# Patient Record
Sex: Female | Born: 1945 | Race: White | Hispanic: No | Marital: Married | Smoking: Never smoker
Health system: Southern US, Community
[De-identification: ages and names within clinical notes are randomized; demographics above are authoritative.]

## PROBLEM LIST (undated history)

## (undated) DIAGNOSIS — R319 Hematuria, unspecified: Secondary | ICD-10-CM

## (undated) DIAGNOSIS — E119 Type 2 diabetes mellitus without complications: Secondary | ICD-10-CM

## (undated) DIAGNOSIS — M810 Age-related osteoporosis without current pathological fracture: Secondary | ICD-10-CM

## (undated) DIAGNOSIS — E559 Vitamin D deficiency, unspecified: Secondary | ICD-10-CM

## (undated) DIAGNOSIS — I10 Essential (primary) hypertension: Secondary | ICD-10-CM

## (undated) DIAGNOSIS — N182 Chronic kidney disease, stage 2 (mild): Secondary | ICD-10-CM

## (undated) DIAGNOSIS — I491 Atrial premature depolarization: Secondary | ICD-10-CM

## (undated) DIAGNOSIS — M791 Myalgia, unspecified site: Secondary | ICD-10-CM

## (undated) DIAGNOSIS — N390 Urinary tract infection, site not specified: Secondary | ICD-10-CM

## (undated) DIAGNOSIS — K219 Gastro-esophageal reflux disease without esophagitis: Secondary | ICD-10-CM

## (undated) DIAGNOSIS — E039 Hypothyroidism, unspecified: Secondary | ICD-10-CM

## (undated) DIAGNOSIS — E538 Deficiency of other specified B group vitamins: Secondary | ICD-10-CM

## (undated) DIAGNOSIS — Z87898 Personal history of other specified conditions: Secondary | ICD-10-CM

## (undated) DIAGNOSIS — D649 Anemia, unspecified: Secondary | ICD-10-CM

## (undated) DIAGNOSIS — E079 Disorder of thyroid, unspecified: Secondary | ICD-10-CM

## (undated) DIAGNOSIS — N950 Postmenopausal bleeding: Secondary | ICD-10-CM

## (undated) DIAGNOSIS — Z8719 Personal history of other diseases of the digestive system: Secondary | ICD-10-CM

## (undated) HISTORY — DX: Age-related osteoporosis without current pathological fracture: M81.0

## (undated) HISTORY — DX: Vitamin D deficiency, unspecified: E55.9

## (undated) HISTORY — DX: Essential (primary) hypertension: I10

## (undated) HISTORY — DX: Myalgia, unspecified site: M79.10

## (undated) HISTORY — DX: Chronic kidney disease, stage 2 (mild): N18.2

## (undated) HISTORY — DX: Hypothyroidism, unspecified: E03.9

## (undated) HISTORY — DX: Disorder of thyroid, unspecified: E07.9

## (undated) HISTORY — DX: Deficiency of other specified B group vitamins: E53.8

## (undated) HISTORY — DX: Hematuria, unspecified: R31.9

## (undated) HISTORY — PX: ANKLE SURGERY: SHX546

---

## 1998-07-07 HISTORY — PX: ANKLE SURGERY: SHX546

## 2005-04-14 ENCOUNTER — Ambulatory Visit: Payer: Self-pay | Admitting: Urology

## 2005-11-25 ENCOUNTER — Ambulatory Visit: Payer: Self-pay | Admitting: *Deleted

## 2007-01-13 ENCOUNTER — Ambulatory Visit: Payer: Self-pay | Admitting: *Deleted

## 2007-01-29 ENCOUNTER — Ambulatory Visit: Payer: Self-pay | Admitting: *Deleted

## 2008-02-10 ENCOUNTER — Ambulatory Visit: Payer: Self-pay | Admitting: *Deleted

## 2009-12-14 ENCOUNTER — Ambulatory Visit: Payer: Self-pay | Admitting: Family Medicine

## 2010-10-27 ENCOUNTER — Emergency Department: Payer: Self-pay | Admitting: Emergency Medicine

## 2011-02-25 ENCOUNTER — Ambulatory Visit: Payer: Self-pay | Admitting: Family Medicine

## 2012-02-26 ENCOUNTER — Ambulatory Visit: Payer: Self-pay | Admitting: Family Medicine

## 2013-02-28 ENCOUNTER — Ambulatory Visit: Payer: Self-pay | Admitting: Nurse Practitioner

## 2015-03-26 ENCOUNTER — Other Ambulatory Visit: Payer: Self-pay | Admitting: Obstetrics and Gynecology

## 2015-03-26 DIAGNOSIS — Z1211 Encounter for screening for malignant neoplasm of colon: Secondary | ICD-10-CM

## 2015-03-26 DIAGNOSIS — E039 Hypothyroidism, unspecified: Secondary | ICD-10-CM

## 2015-03-26 DIAGNOSIS — N182 Chronic kidney disease, stage 2 (mild): Secondary | ICD-10-CM

## 2015-03-26 DIAGNOSIS — R739 Hyperglycemia, unspecified: Secondary | ICD-10-CM

## 2015-03-26 DIAGNOSIS — Z1231 Encounter for screening mammogram for malignant neoplasm of breast: Secondary | ICD-10-CM

## 2015-03-26 DIAGNOSIS — I10 Essential (primary) hypertension: Secondary | ICD-10-CM

## 2017-03-11 ENCOUNTER — Other Ambulatory Visit: Payer: Self-pay | Admitting: Family Medicine

## 2017-03-11 DIAGNOSIS — Z1231 Encounter for screening mammogram for malignant neoplasm of breast: Secondary | ICD-10-CM

## 2019-07-18 ENCOUNTER — Other Ambulatory Visit: Payer: Self-pay | Admitting: Family Medicine

## 2019-07-18 DIAGNOSIS — Z1231 Encounter for screening mammogram for malignant neoplasm of breast: Secondary | ICD-10-CM

## 2019-07-25 ENCOUNTER — Ambulatory Visit: Payer: Self-pay | Admitting: Cardiology

## 2019-07-25 ENCOUNTER — Ambulatory Visit
Admission: RE | Admit: 2019-07-25 | Discharge: 2019-07-25 | Disposition: A | Discharge status: Home / Self Care | Payer: Medicare Other | Source: Ambulatory Visit | Attending: Family Medicine | Admitting: Family Medicine

## 2019-07-25 DIAGNOSIS — Z1231 Encounter for screening mammogram for malignant neoplasm of breast: Secondary | ICD-10-CM | POA: Diagnosis present

## 2019-07-28 ENCOUNTER — Other Ambulatory Visit: Payer: Self-pay | Admitting: Family Medicine

## 2019-07-28 DIAGNOSIS — N6320 Unspecified lump in the left breast, unspecified quadrant: Secondary | ICD-10-CM

## 2019-07-28 DIAGNOSIS — R928 Other abnormal and inconclusive findings on diagnostic imaging of breast: Secondary | ICD-10-CM

## 2019-08-02 ENCOUNTER — Ambulatory Visit
Admission: RE | Admit: 2019-08-02 | Discharge: 2019-08-02 | Disposition: A | Discharge status: Home / Self Care | Payer: Medicare Other | Source: Ambulatory Visit | Attending: Family Medicine | Admitting: Family Medicine

## 2019-08-02 DIAGNOSIS — N6320 Unspecified lump in the left breast, unspecified quadrant: Secondary | ICD-10-CM | POA: Diagnosis present

## 2019-08-02 DIAGNOSIS — R928 Other abnormal and inconclusive findings on diagnostic imaging of breast: Secondary | ICD-10-CM | POA: Diagnosis not present

## 2019-08-07 DIAGNOSIS — I491 Atrial premature depolarization: Secondary | ICD-10-CM | POA: Insufficient documentation

## 2019-08-07 DIAGNOSIS — R002 Palpitations: Secondary | ICD-10-CM | POA: Insufficient documentation

## 2019-08-07 NOTE — Progress Notes (Signed)
Cardiology Office Note  Date:  08/08/2019   ID:  Megan Sheppard, Hepner 1946/06/23, MRN [id number]  PCP:  Marguerita Merles, MD   Chief Complaint  Patient presents with  . other    Palpitations no complaints today. Meds reviewed verbally with pt.    HPI:  [name] is a 74 year old woman with past medical history of Palpitations Hypertension Obesity Nonsmoker, very good chol "hiatal hernia" seen on EGD,  Referred by Dr. Lennox Grumbles for palpitations, ekg with frequent PACs   Lab work reviewed with her in detail today HAB1c 6.4 down to 6.3 Total chol 127 LDL 52  deconditioned , no regular exercise program Weight is high No exercise[place]  Denies any tachycardia or palpitations No near syncope or syncope  Was seen by primary care, noted to have PACs on EKG EKG has been requested on today's visit for our review  EKG performed in the office today personally reviewed by myself on todays visit Shows NSR rate 84 no ST or T wave changes  Long discussion concerning her family history Mother with CAD, CABG Brother with ALS Sister with brain ca   PMH:   has a past medical history of CKD (chronic kidney disease), stage II, Hematuria, Hypertension, Hypothyroidism, Myalgia, Osteoporosis, Thyroid disease, Vitamin B12 deficiency, and Vitamin D deficiency.  PSH:    Past Surgical History:  Procedure Laterality Date  . ANKLE SURGERY Right     Current Outpatient Medications  Medication Sig Dispense Refill  . amLODipine (NORVASC) 5 MG tablet Take by mouth daily.    . benazepril (LOTENSIN) 20 MG tablet Take by mouth daily.    . cholecalciferol (VITAMIN D3) 25 MCG (1000 UNIT) tablet Take 2,000 Units by mouth daily.    . Cyanocobalamin (B-12 PO) Take by mouth daily.    Marland Kitchen levothyroxine (SYNTHROID) 50 MCG tablet Take by mouth daily.     No current facility-administered medications for this visit.    Allergies:   Iodine, Amoxicillin, Hydrochlorothiazide, Penicillin g,  and Sulfa antibiotics   Social History:  The patient     Family History:   family history includes Breast cancer in her sister.    Review of Systems: Review of Systems  Constitutional: Negative.   HENT: Negative.   Respiratory: Negative.   Cardiovascular: Negative.   Gastrointestinal: Negative.   Musculoskeletal: Negative.   Neurological: Negative.   Psychiatric/Behavioral: Negative.   All other systems reviewed and are negative.    PHYSICAL EXAM: VS:  BP (!) 148/84 (BP Location: Right Arm, Patient Position: Sitting, Cuff Size: Normal)   Pulse 84   Ht 5\' 1"  (1.549 m)   Wt 218 lb 8 oz (99.1 kg)   SpO2 98%   BMI 41.29 kg/m  , BMI Body mass index is 41.29 kg/m. GEN: Well nourished, well developed, in no acute distress obese HEENT: normal Neck: no JVD, carotid bruits, or masses Cardiac: RRR; no murmurs, rubs, or gallops,no edema  Respiratory:  clear to auscultation bilaterally, normal work of breathing GI: soft, nontender, nondistended, + BS MS: no deformity or atrophy Skin: warm and dry, no rash Neuro:  Strength and sensation are intact Psych: euthymic mood, full affect   Recent Labs: No results found for requested labs within last 8760 hours.    Lipid Panel No results found for: CHOL, HDL, LDLCALC, TRIG    Wt Readings from Last 3 Encounters:  08/08/19 218 lb 8 oz (99.1 kg)       ASSESSMENT AND  PLAN:  Problem List Items Addressed This Visit      Cardiology Problems   PAC (premature atrial contraction)   Relevant Medications   amLODipine (NORVASC) 5 MG tablet   benazepril (LOTENSIN) 20 MG tablet   Other Relevant Orders   EKG 12-Lead     Other   Palpitations    Other Visit Diagnoses    Morbid obesity (HCC)    -  Primary   Family history of premature coronary artery disease         PACs Noted on EKG through primary care, EKG has been requested Normal EKG on today's visit She reports she is asymptomatic, denies any palpitations, near-syncope or  syncope -Otherwise normal EKG No further testing at this time as she is asymptomatic Discussed various types of arrhythmia in detail with her If she were to become symptomatic we would recommend a ZIO monitor, could consider low-dose beta-blocker but only if symptomatic  Morbid obesity We have encouraged continued exercise, careful diet management in an effort to lose weight.  Essential hypertension Blood pressure reasonable, we have recommended walking program, weight loss No medication changes made  Elevated glucose/borderline diabetes Hemoglobin A1c numbers discussed with her, recommended weight loss, Dietary guide was given to her [place], recommended walking program  Disposition:   F/U as needed   Total encounter time more than 45 minutes  Greater than 50% was spent in counseling and coordination of care with the patient   Signed, Dossie Arbour, M.D., Ph.D. [facility] [place], [place] [phone]

## 2019-08-08 ENCOUNTER — Encounter: Payer: Self-pay | Admitting: Cardiovascular Disease

## 2019-08-08 ENCOUNTER — Other Ambulatory Visit: Payer: Self-pay

## 2019-08-08 ENCOUNTER — Ambulatory Visit (INDEPENDENT_AMBULATORY_CARE_PROVIDER_SITE_OTHER): Payer: Medicare Other | Admitting: Cardiovascular Disease

## 2019-08-08 DIAGNOSIS — Z8249 Family history of ischemic heart disease and other diseases of the circulatory system: Secondary | ICD-10-CM

## 2019-08-08 DIAGNOSIS — I491 Atrial premature depolarization: Secondary | ICD-10-CM | POA: Diagnosis not present

## 2019-08-08 DIAGNOSIS — R002 Palpitations: Secondary | ICD-10-CM

## 2019-08-08 NOTE — Patient Instructions (Signed)

## 2021-03-02 IMAGING — US US BREAST*L* LIMITED INC AXILLA
1 series · 8 of 8 positions shown · non-contrast
Comparison: Previous exam(s).

CLINICAL DATA: Possible mass in the outer left breast on a recent
screening mammogram.

EXAM:
DIGITAL DIAGNOSTIC LEFT MAMMOGRAM WITH TOMO
ULTRASOUND LEFT BREAST

[Series 1: us breast*left* limited inc axilla · 0.07mm/px · 8 of 8 slices shown]
[im 1/8]
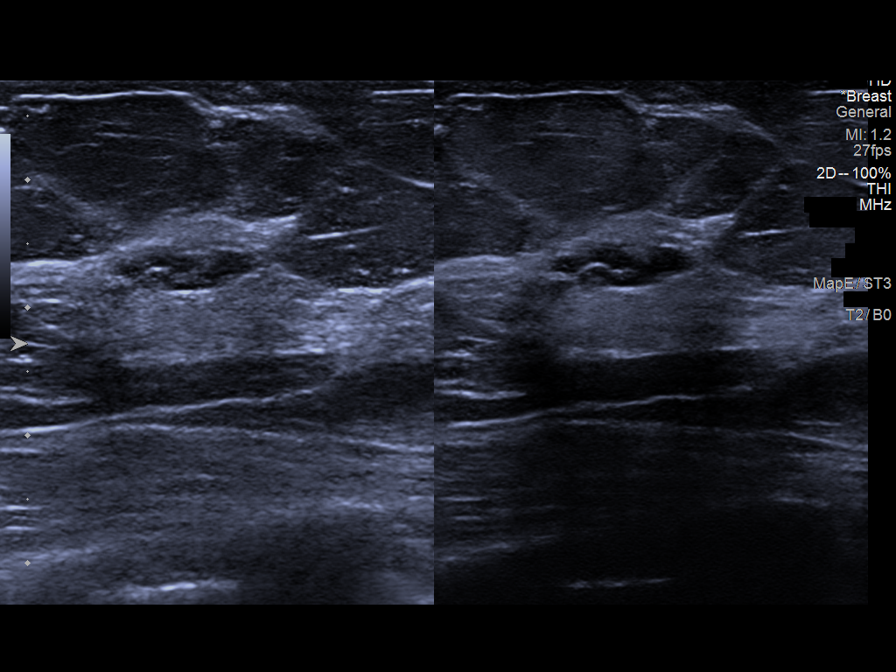
[im 2/8]
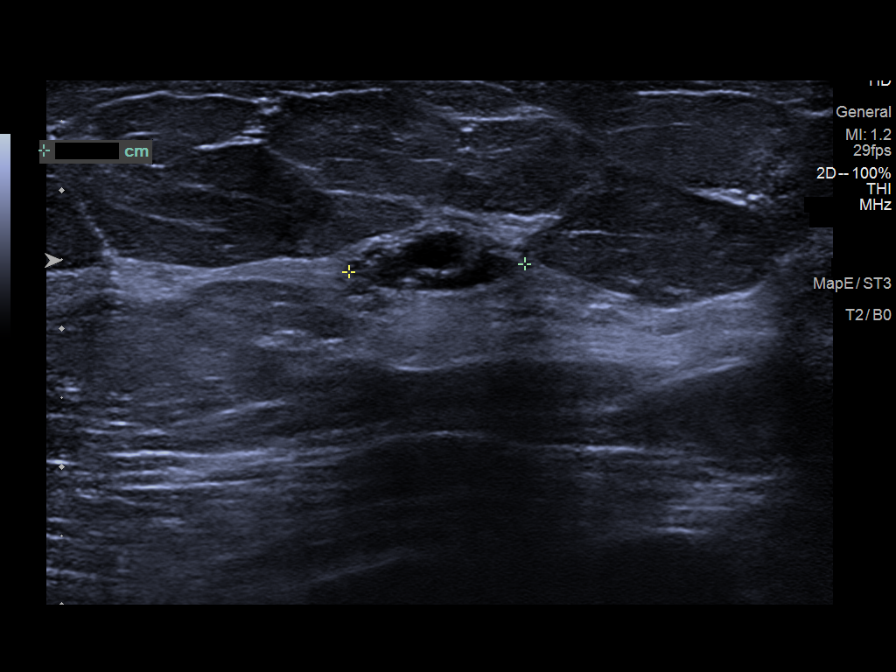
[im 3/8]
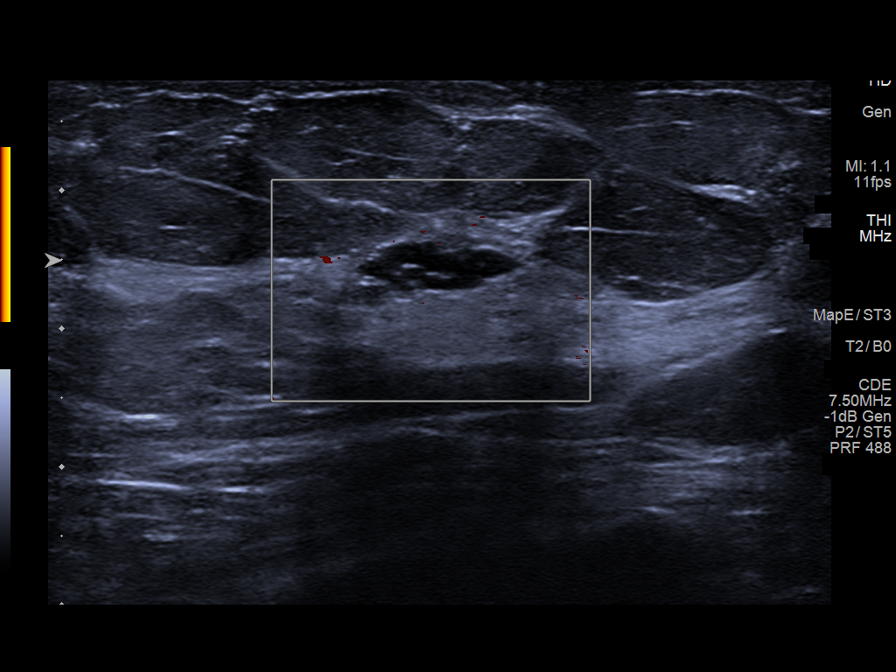
[im 4/8]
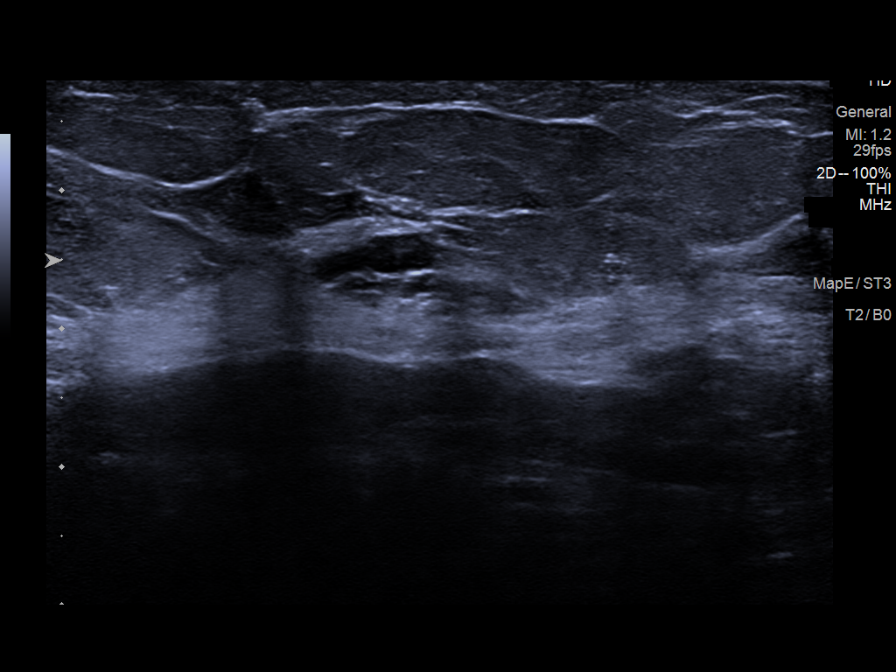
[im 5/8]
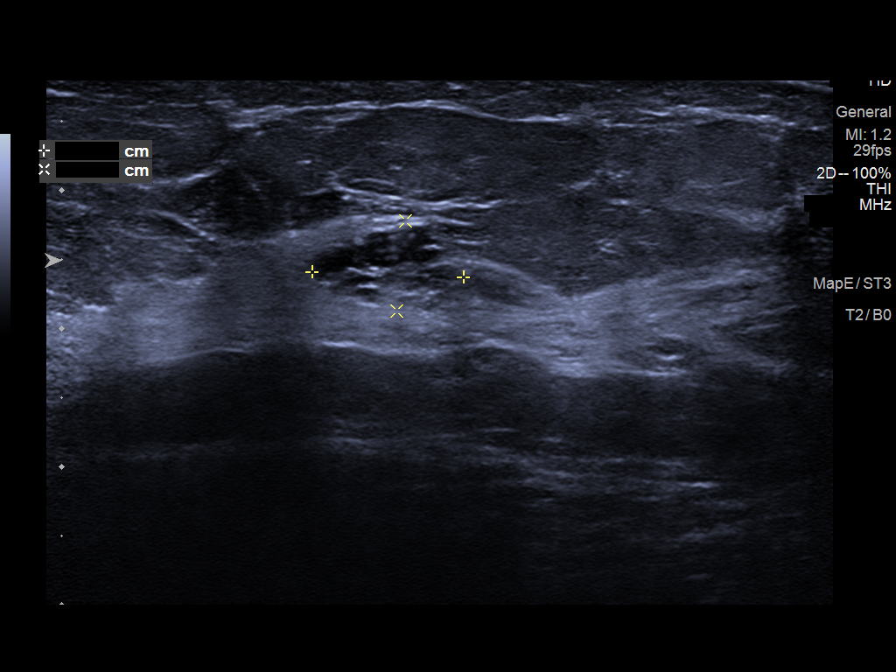
[im 6/8]
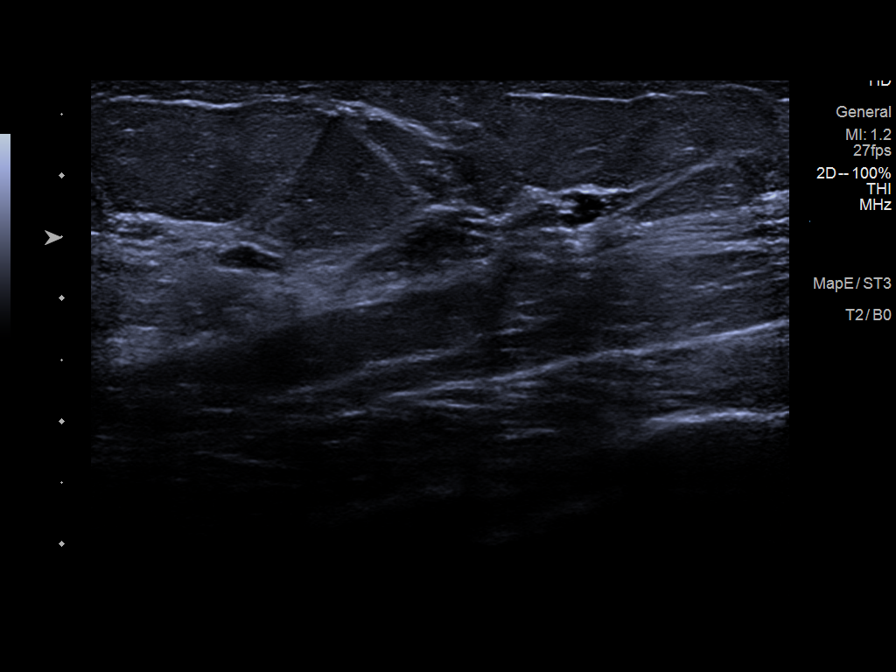
[im 7/8]
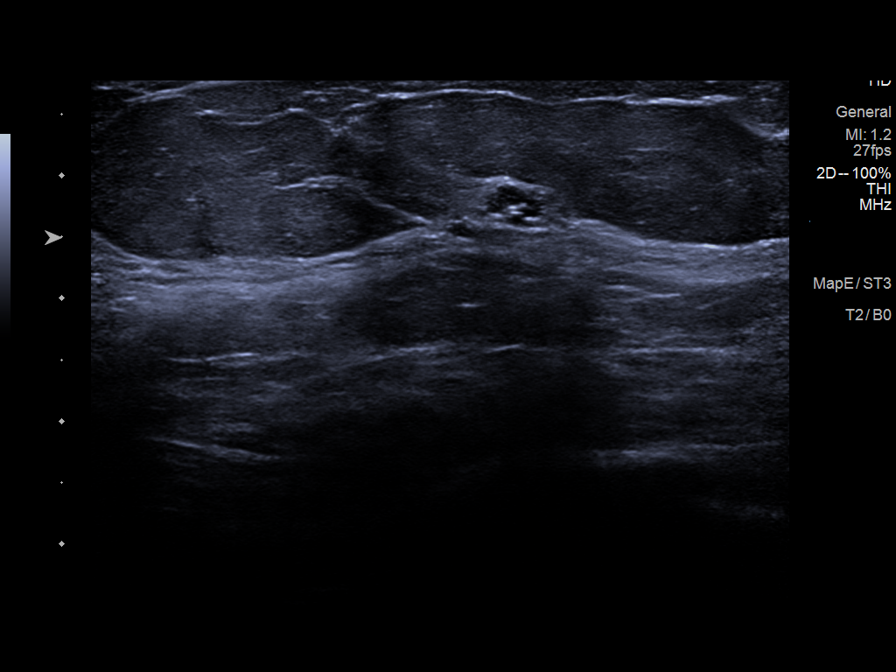
[im 8/8]
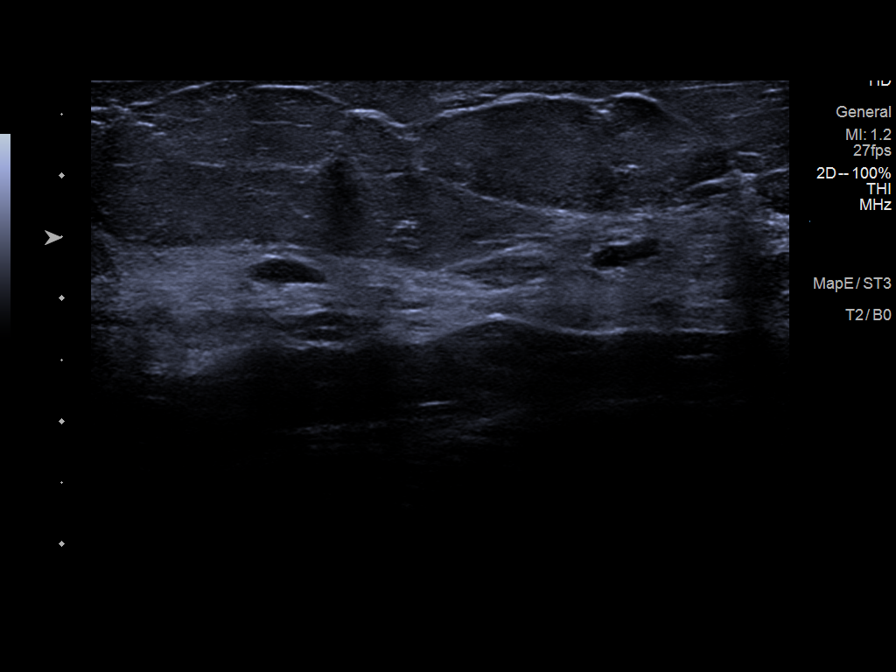

[8 of 8 positions shown; findings below may reference images not displayed]

ACR Breast Density Category b: There are scattered areas of
fibroglandular density.
FINDINGS: 3D tomographic and 2D generated spot compression views of the left
breast were obtained. There is a group multiple small rounded and
oval masses in the outer left breast. The majority of the margins
are circumscribed and others are indistinct or obscured.

Targeted ultrasound is performed, showing a 1.3 cm cluster of cysts
in the 2:30 o'clock position of the left breast, 5 cm from the
nipple. There are multiple additional smaller cysts in that region
of the breast.
IMPRESSION: Benign left breast cysts.  No evidence of malignancy.

RECOMMENDATION:
Bilateral screening mammogram in 1 year.

I have discussed the findings and recommendations with the patient.
If applicable, a reminder letter will be sent to the patient
regarding the next appointment.

BI-RADS CATEGORY  2: Benign.

## 2021-03-02 IMAGING — MG MM DIGITAL DIAGNOSTIC UNILAT*L* W/ TOMO W/ CAD
4 series · 4 of 12 positions shown · non-contrast
Comparison: Previous exam(s).

CLINICAL DATA: Possible mass in the outer left breast on a recent
screening mammogram.

EXAM:
DIGITAL DIAGNOSTIC LEFT MAMMOGRAM WITH TOMO
ULTRASOUND LEFT BREAST

[L MLO synth-2D]
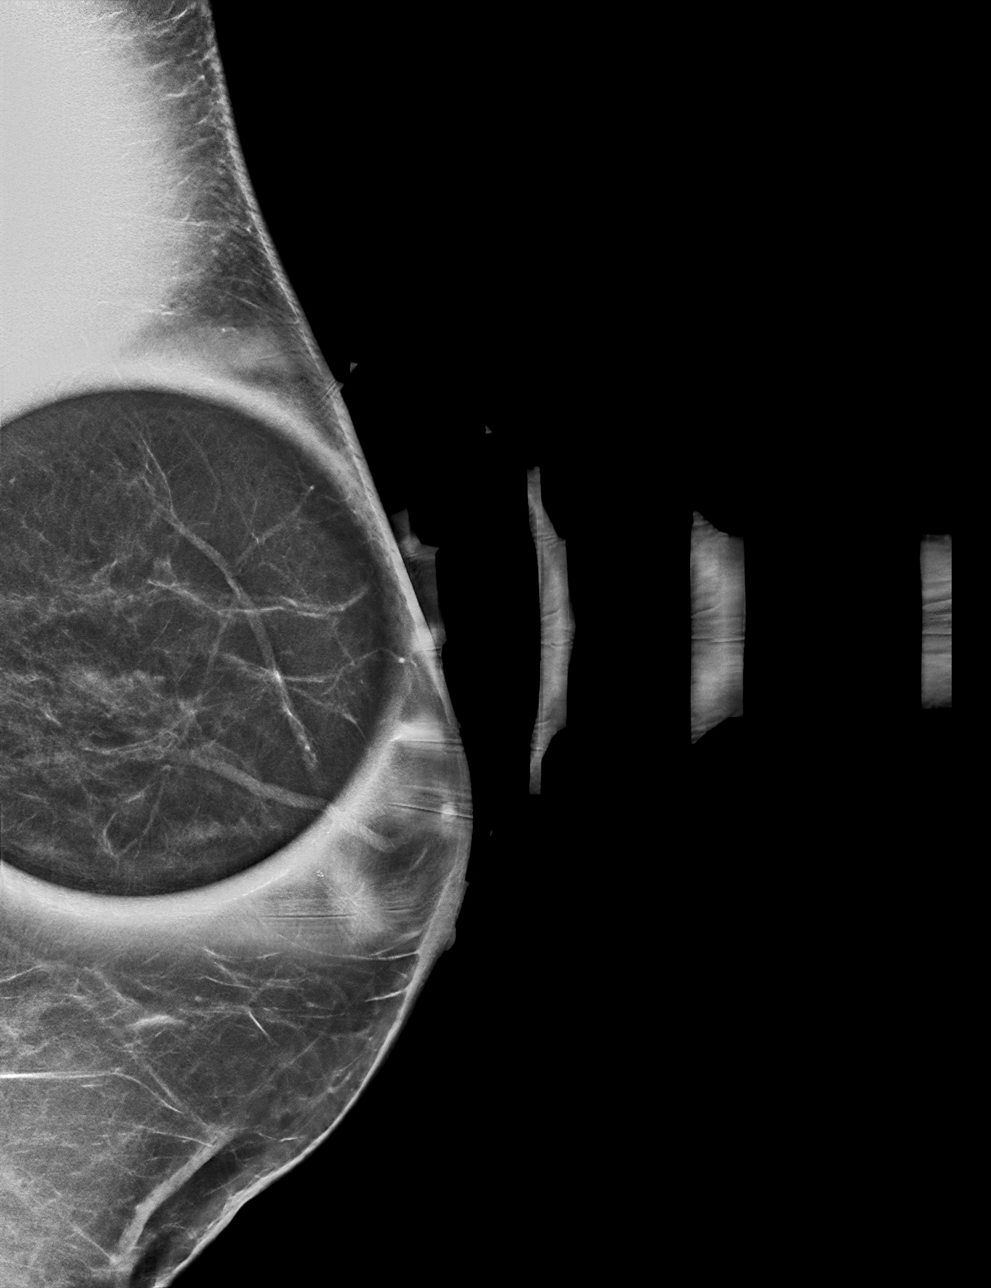

[L CC synth-2D]
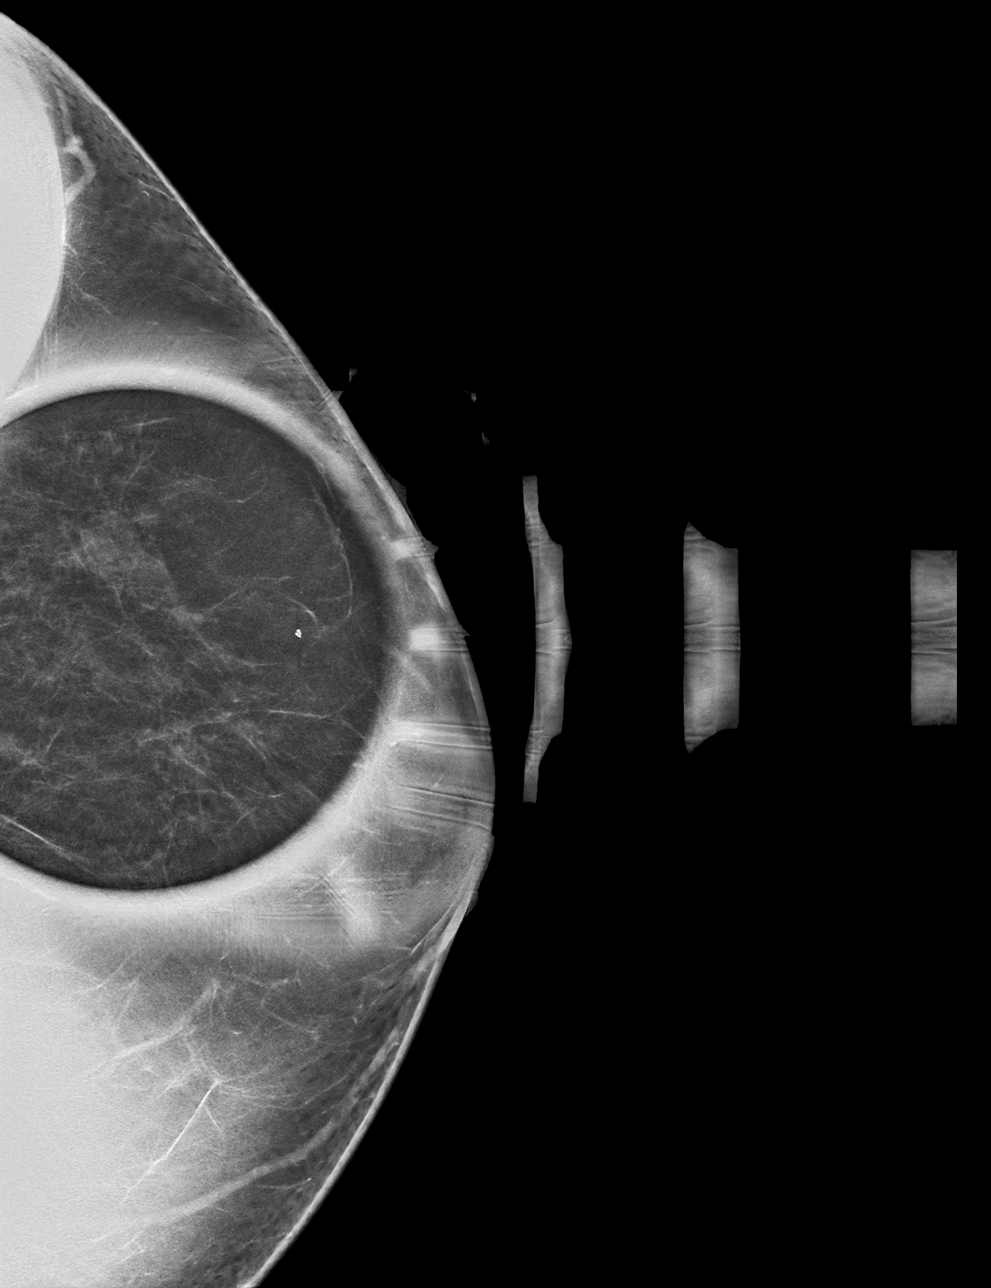

[L MLO tomo · tomo slice 31/60.0]
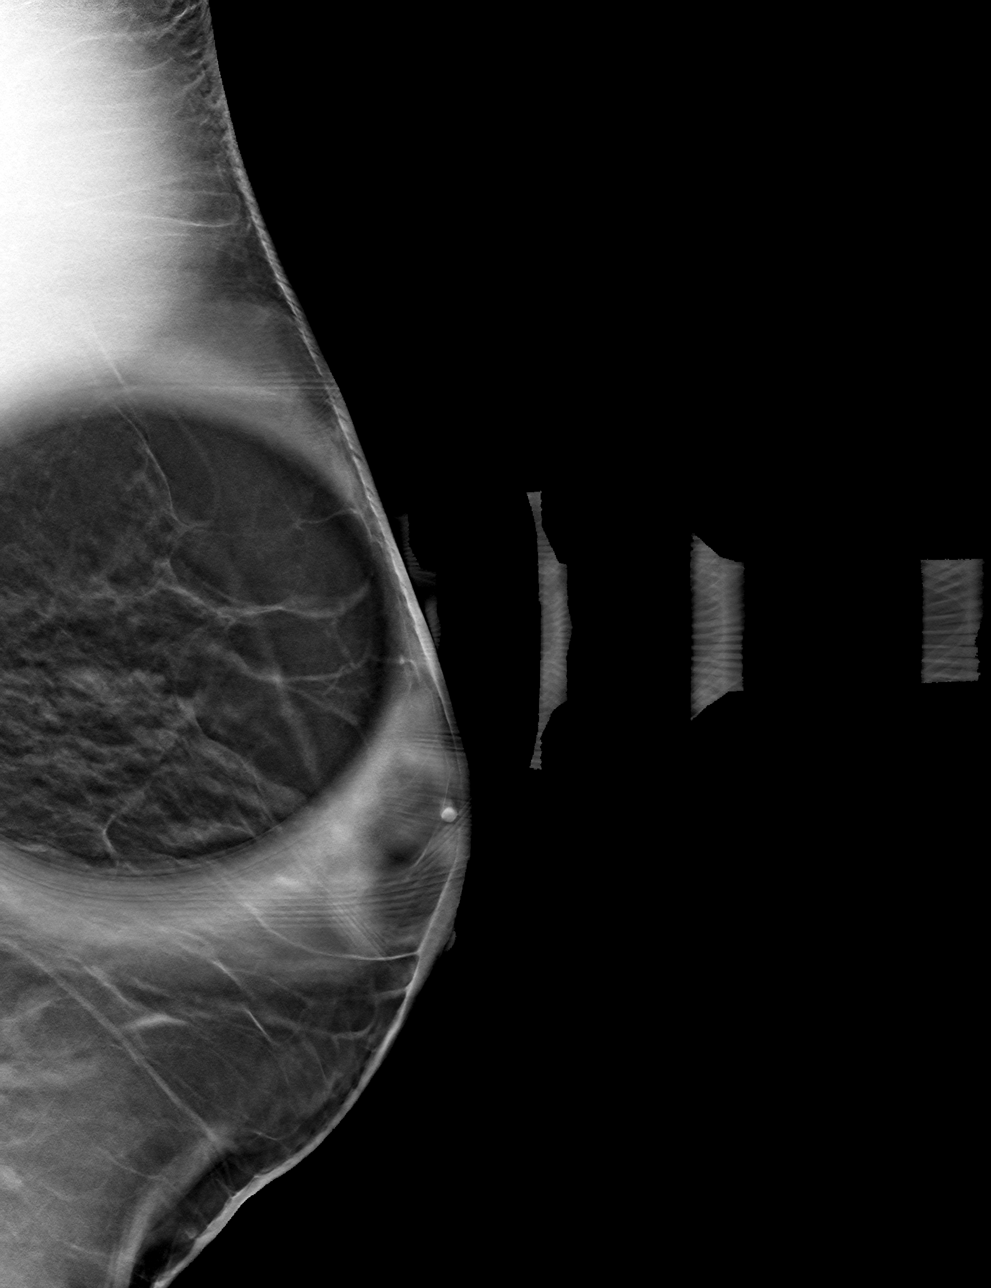

[L CC tomo · tomo slice 27/54.0]
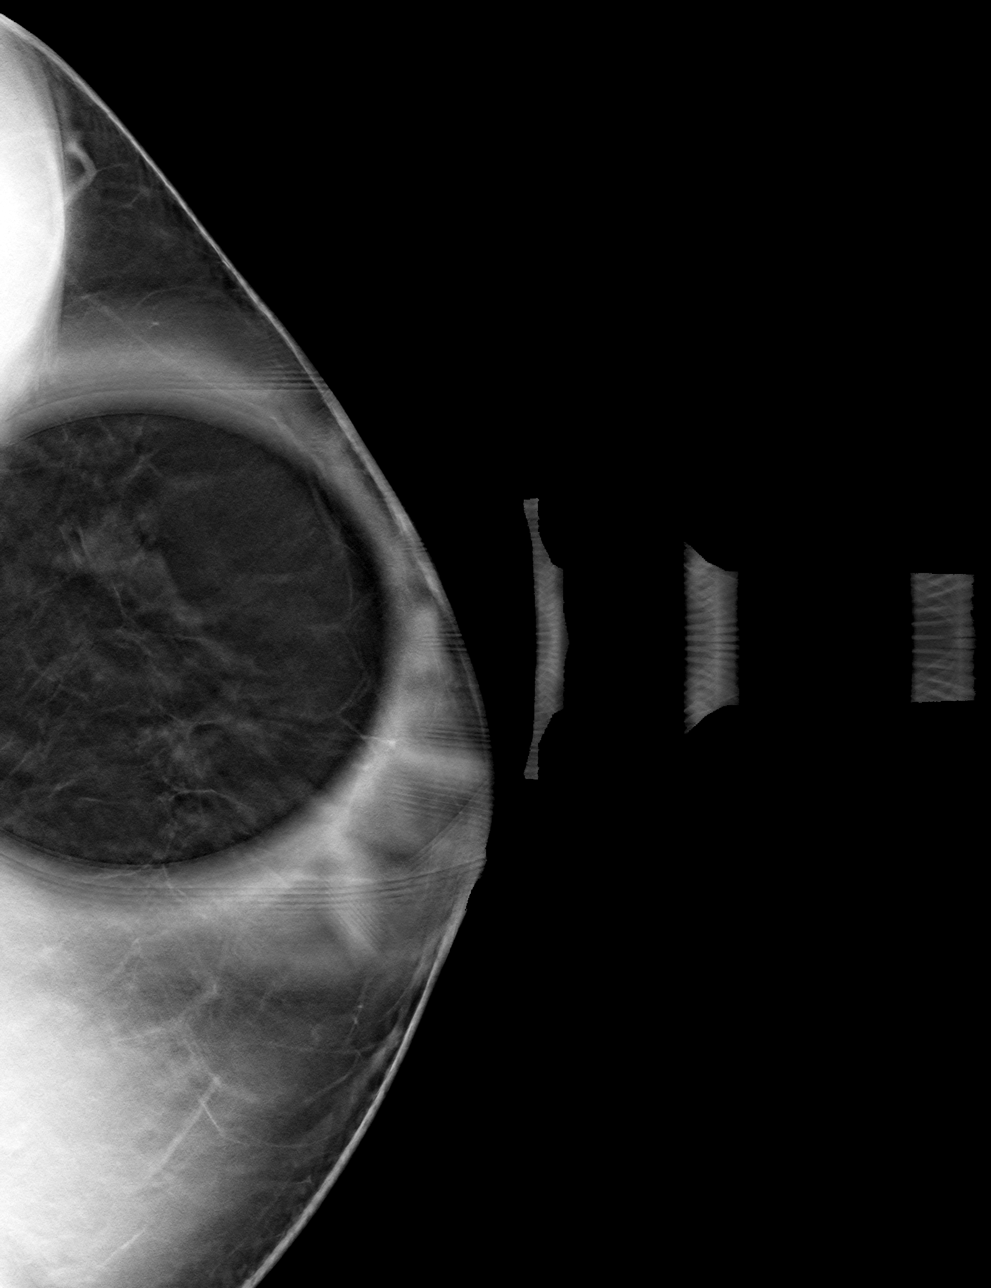

[4 of 12 positions shown; findings below may reference images not displayed]

ACR Breast Density Category b: There are scattered areas of
fibroglandular density.
FINDINGS: 3D tomographic and 2D generated spot compression views of the left
breast were obtained. There is a group multiple small rounded and
oval masses in the outer left breast. The majority of the margins
are circumscribed and others are indistinct or obscured.

Targeted ultrasound is performed, showing a 1.3 cm cluster of cysts
in the 2:30 o'clock position of the left breast, 5 cm from the
nipple. There are multiple additional smaller cysts in that region
of the breast.
IMPRESSION: Benign left breast cysts.  No evidence of malignancy.

RECOMMENDATION:
Bilateral screening mammogram in 1 year.

I have discussed the findings and recommendations with the patient.
If applicable, a reminder letter will be sent to the patient
regarding the next appointment.

BI-RADS CATEGORY  2: Benign.

## 2021-07-03 ENCOUNTER — Other Ambulatory Visit: Payer: Self-pay | Admitting: Family Medicine

## 2021-07-03 DIAGNOSIS — Z1231 Encounter for screening mammogram for malignant neoplasm of breast: Secondary | ICD-10-CM

## 2021-08-06 ENCOUNTER — Other Ambulatory Visit: Payer: Self-pay | Admitting: Ophthalmology

## 2021-08-06 ENCOUNTER — Other Ambulatory Visit
Admission: RE | Admit: 2021-08-06 | Discharge: 2021-08-06 | Disposition: A | Discharge status: Home / Self Care | Payer: Medicare Other | Attending: Ophthalmology | Admitting: Ophthalmology

## 2021-08-06 DIAGNOSIS — H4920 Sixth [abducent] nerve palsy, unspecified eye: Secondary | ICD-10-CM

## 2021-08-06 LAB — CBC WITH DIFFERENTIAL/PLATELET
Abs Immature Granulocytes: 0.04 10*3/uL (ref 0.00–0.07)
Basophils Absolute: 0.1 10*3/uL (ref 0.0–0.1)
Basophils Relative: 1 %
Eosinophils Absolute: 0.1 10*3/uL (ref 0.0–0.5)
Eosinophils Relative: 2 %
HCT: 44.2 % (ref 36.0–46.0)
Hemoglobin: 14.7 g/dL (ref 12.0–15.0)
Immature Granulocytes: 1 %
Lymphocytes Relative: 18 %
Lymphs Abs: 1.5 10*3/uL (ref 0.7–4.0)
MCH: 27.8 pg (ref 26.0–34.0)
MCHC: 33.3 g/dL (ref 30.0–36.0)
MCV: 83.6 fL (ref 80.0–100.0)
Monocytes Absolute: 0.7 10*3/uL (ref 0.1–1.0)
Monocytes Relative: 8 %
Neutro Abs: 5.9 10*3/uL (ref 1.7–7.7)
Neutrophils Relative %: 70 %
Platelets: 230 10*3/uL (ref 150–400)
RBC: 5.29 MIL/uL — ABNORMAL HIGH (ref 3.87–5.11)
RDW: 14.4 % (ref 11.5–15.5)
WBC: 8.3 10*3/uL (ref 4.0–10.5)
nRBC: 0 % (ref 0.0–0.2)

## 2021-08-06 LAB — C-REACTIVE PROTEIN: CRP: 1.9 mg/dL — ABNORMAL HIGH (ref <1.0)

## 2021-08-06 LAB — SEDIMENTATION RATE: Sed Rate: 11 mm/hr (ref 0–30)

## 2021-08-08 ENCOUNTER — Ambulatory Visit
Admission: RE | Admit: 2021-08-08 | Discharge: 2021-08-08 | Disposition: A | Discharge status: Home / Self Care | Payer: Medicare Other | Source: Ambulatory Visit | Attending: Ophthalmology | Admitting: Ophthalmology

## 2021-08-08 DIAGNOSIS — H4920 Sixth [abducent] nerve palsy, unspecified eye: Secondary | ICD-10-CM | POA: Diagnosis present

## 2021-09-03 ENCOUNTER — Encounter (HOSPITAL_COMMUNITY): Payer: Self-pay | Admitting: Radiology

## 2021-12-30 ENCOUNTER — Observation Stay
Admission: EM | Admit: 2021-12-30 | Discharge: 2022-01-01 | Disposition: A | Discharge status: Home / Self Care | Payer: Medicare Other | Attending: Internal Medicine | Admitting: Internal Medicine

## 2021-12-30 ENCOUNTER — Other Ambulatory Visit: Payer: Self-pay

## 2021-12-30 ENCOUNTER — Encounter: Payer: Self-pay | Admitting: Emergency Medicine

## 2021-12-30 ENCOUNTER — Observation Stay: Payer: Medicare Other

## 2021-12-30 DIAGNOSIS — R6520 Severe sepsis without septic shock: Secondary | ICD-10-CM | POA: Insufficient documentation

## 2021-12-30 DIAGNOSIS — K5730 Diverticulosis of large intestine without perforation or abscess without bleeding: Secondary | ICD-10-CM | POA: Insufficient documentation

## 2021-12-30 DIAGNOSIS — I129 Hypertensive chronic kidney disease with stage 1 through stage 4 chronic kidney disease, or unspecified chronic kidney disease: Secondary | ICD-10-CM | POA: Diagnosis not present

## 2021-12-30 DIAGNOSIS — E039 Hypothyroidism, unspecified: Secondary | ICD-10-CM | POA: Diagnosis not present

## 2021-12-30 DIAGNOSIS — N12 Tubulo-interstitial nephritis, not specified as acute or chronic: Secondary | ICD-10-CM | POA: Diagnosis not present

## 2021-12-30 DIAGNOSIS — E1122 Type 2 diabetes mellitus with diabetic chronic kidney disease: Secondary | ICD-10-CM | POA: Diagnosis not present

## 2021-12-30 DIAGNOSIS — Z79899 Other long term (current) drug therapy: Secondary | ICD-10-CM | POA: Diagnosis not present

## 2021-12-30 DIAGNOSIS — E1169 Type 2 diabetes mellitus with other specified complication: Secondary | ICD-10-CM

## 2021-12-30 DIAGNOSIS — A419 Sepsis, unspecified organism: Secondary | ICD-10-CM | POA: Insufficient documentation

## 2021-12-30 DIAGNOSIS — E1165 Type 2 diabetes mellitus with hyperglycemia: Secondary | ICD-10-CM | POA: Insufficient documentation

## 2021-12-30 DIAGNOSIS — N390 Urinary tract infection, site not specified: Secondary | ICD-10-CM | POA: Diagnosis present

## 2021-12-30 DIAGNOSIS — N182 Chronic kidney disease, stage 2 (mild): Secondary | ICD-10-CM | POA: Diagnosis not present

## 2021-12-30 DIAGNOSIS — E119 Type 2 diabetes mellitus without complications: Secondary | ICD-10-CM

## 2021-12-30 LAB — CBC WITH DIFFERENTIAL/PLATELET
Abs Immature Granulocytes: 0.14 10*3/uL — ABNORMAL HIGH (ref 0.00–0.07)
Basophils Absolute: 0 10*3/uL (ref 0.0–0.1)
Basophils Relative: 0 %
Eosinophils Absolute: 0 10*3/uL (ref 0.0–0.5)
Eosinophils Relative: 0 %
HCT: 43 % (ref 36.0–46.0)
Hemoglobin: 13.8 g/dL (ref 12.0–15.0)
Immature Granulocytes: 1 %
Lymphocytes Relative: 2 %
Lymphs Abs: 0.3 10*3/uL — ABNORMAL LOW (ref 0.7–4.0)
MCH: 27.7 pg (ref 26.0–34.0)
MCHC: 32.1 g/dL (ref 30.0–36.0)
MCV: 86.3 fL (ref 80.0–100.0)
Monocytes Absolute: 0.6 10*3/uL (ref 0.1–1.0)
Monocytes Relative: 3 %
Neutro Abs: 18.8 10*3/uL — ABNORMAL HIGH (ref 1.7–7.7)
Neutrophils Relative %: 94 %
Platelets: 261 10*3/uL (ref 150–400)
RBC: 4.98 MIL/uL (ref 3.87–5.11)
RDW: 15.4 % (ref 11.5–15.5)
WBC: 20 10*3/uL — ABNORMAL HIGH (ref 4.0–10.5)
nRBC: 0 % (ref 0.0–0.2)

## 2021-12-30 LAB — BASIC METABOLIC PANEL
Anion gap: 10 (ref 5–15)
BUN: 15 mg/dL (ref 8–23)
CO2: 22 mmol/L (ref 22–32)
Calcium: 8.8 mg/dL — ABNORMAL LOW (ref 8.9–10.3)
Chloride: 107 mmol/L (ref 98–111)
Creatinine, Ser: 1.11 mg/dL — ABNORMAL HIGH (ref 0.44–1.00)
GFR, Estimated: 51 mL/min — ABNORMAL LOW (ref >60)
Glucose, Bld: 242 mg/dL — ABNORMAL HIGH (ref 70–99)
Potassium: 4 mmol/L (ref 3.5–5.1)
Sodium: 139 mmol/L (ref 135–145)

## 2021-12-30 LAB — TROPONIN I (HIGH SENSITIVITY)
Troponin I (High Sensitivity): 7 ng/L (ref <18)
Troponin I (High Sensitivity): 7 ng/L (ref <18)

## 2021-12-30 LAB — LACTIC ACID, PLASMA
Lactic Acid, Venous: 2.6 mmol/L (ref 0.5–1.9)
Lactic Acid, Venous: 2.2 mmol/L (ref 0.5–1.9)

## 2021-12-30 LAB — GLUCOSE, CAPILLARY: Glucose-Capillary: 136 mg/dL — ABNORMAL HIGH (ref 70–99)

## 2021-12-30 LAB — CBG MONITORING, ED: Glucose-Capillary: 166 mg/dL — ABNORMAL HIGH (ref 70–99)

## 2021-12-30 MED ORDER — ONDANSETRON HCL 4 MG/2ML IJ SOLN
4.0000 mg | Freq: Once | INTRAMUSCULAR | Status: DC
  Filled 2021-12-30: qty 2

## 2021-12-30 MED ORDER — ACETAMINOPHEN 650 MG RE SUPP: 650.0000 mg | Freq: Four times a day (QID) | RECTAL | Status: DC | PRN

## 2021-12-30 MED ORDER — SODIUM CHLORIDE 0.45 % IV SOLN: INTRAVENOUS | Status: DC

## 2021-12-30 MED ORDER — SODIUM CHLORIDE 0.9 % IV SOLN
1.0000 g | INTRAVENOUS | Status: DC
  Administered 2021-12-31 – 2022-01-01 (×2): 1 g via INTRAVENOUS
  Filled 2021-12-30: qty 1
  Filled 2021-12-30: qty 10

## 2021-12-30 MED ORDER — SENNA 8.6 MG PO TABS
1.0000 | ORAL_TABLET | Freq: Two times a day (BID) | ORAL | Status: DC
  Administered 2021-12-31 – 2022-01-01 (×2): 8.6 mg via ORAL
  Filled 2021-12-30 (×3): qty 1

## 2021-12-30 MED ORDER — AMLODIPINE BESYLATE 5 MG PO TABS
5.0000 mg | ORAL_TABLET | Freq: Every day | ORAL | Status: DC
  Administered 2021-12-31 – 2022-01-01 (×2): 5 mg via ORAL
  Filled 2021-12-30 (×2): qty 1

## 2021-12-30 MED ORDER — SODIUM CHLORIDE 0.9 % IV BOLUS
1000.0000 mL | Freq: Once | INTRAVENOUS | Status: AC
  Administered 2021-12-30: 1000 mL via INTRAVENOUS

## 2021-12-30 MED ORDER — ACETAMINOPHEN 325 MG PO TABS: 650.0000 mg | ORAL_TABLET | Freq: Four times a day (QID) | ORAL | Status: DC | PRN

## 2021-12-30 MED ORDER — ENOXAPARIN SODIUM 60 MG/0.6ML IJ SOSY
0.5000 mg/kg | PREFILLED_SYRINGE | INTRAMUSCULAR | Status: DC
  Administered 2021-12-30 – 2021-12-31 (×2): 47.5 mg via SUBCUTANEOUS
  Filled 2021-12-30 (×2): qty 0.6

## 2021-12-30 MED ORDER — TRAZODONE HCL 50 MG PO TABS: 25.0000 mg | ORAL_TABLET | Freq: Every evening | ORAL | Status: DC | PRN

## 2021-12-30 MED ORDER — IOHEXOL 300 MG/ML  SOLN: 80.0000 mL | Freq: Once | INTRAMUSCULAR | Status: DC | PRN

## 2021-12-30 MED ORDER — BENAZEPRIL HCL 20 MG PO TABS
20.0000 mg | ORAL_TABLET | Freq: Every day | ORAL | Status: DC
  Administered 2021-12-31 – 2022-01-01 (×2): 20 mg via ORAL
  Filled 2021-12-30 (×2): qty 1

## 2021-12-30 MED ORDER — INSULIN ASPART 100 UNIT/ML IJ SOLN
0.0000 [IU] | Freq: Three times a day (TID) | INTRAMUSCULAR | Status: DC
  Administered 2021-12-30 – 2021-12-31 (×2): 4 [IU] via SUBCUTANEOUS
  Administered 2021-12-31: 3 [IU] via SUBCUTANEOUS
  Administered 2021-12-31: 4 [IU] via SUBCUTANEOUS
  Filled 2021-12-30 (×4): qty 1

## 2021-12-30 MED ORDER — LEVOTHYROXINE SODIUM 50 MCG PO TABS
50.0000 ug | ORAL_TABLET | Freq: Every day | ORAL | Status: DC
  Administered 2021-12-31 – 2022-01-01 (×2): 50 ug via ORAL
  Filled 2021-12-30 (×2): qty 1

## 2021-12-30 MED ORDER — CEFTRIAXONE SODIUM 1 G IJ SOLR
1.0000 g | Freq: Once | INTRAMUSCULAR | Status: AC
  Administered 2021-12-30: 1 g via INTRAVENOUS
  Filled 2021-12-30: qty 10

## 2021-12-30 NOTE — Assessment & Plan Note (Addendum)
Patient has been told she was a borderline diabetic. Not on home hypoglycemics.   --A1c 7.0, well controlled --BG have been within inpatient goal Plan: --d/c BG checks and SSI

## 2021-12-30 NOTE — ED Triage Notes (Signed)
Pt to ED via [facility] walk in for concerns of "urosepsis". Pt states that she has UTI and her white count was 18.6 according to info sent from [facility]. Pt is currently in NAD. VSS. Pt is A & O.

## 2021-12-30 NOTE — ED Notes (Signed)
Receiving RN Charlton Haws has agreed to accept [facility] once pt has arrived to inpatient unit, all questions and concerns address.

## 2021-12-30 NOTE — ED Notes (Signed)
Pt resting, off pt to BR, pt does not need to urinate at this time, NAD noted, even RR and unlabored, call bell within reach for assistance, side rails up x2 for safety, pt voiced no concerns or questions at this time, care on going, will continue to monitor.

## 2021-12-31 DIAGNOSIS — N390 Urinary tract infection, site not specified: Secondary | ICD-10-CM | POA: Diagnosis not present

## 2021-12-31 DIAGNOSIS — N3001 Acute cystitis with hematuria: Secondary | ICD-10-CM

## 2021-12-31 DIAGNOSIS — E039 Hypothyroidism, unspecified: Secondary | ICD-10-CM

## 2021-12-31 LAB — BASIC METABOLIC PANEL
Anion gap: 8 (ref 5–15)
BUN: 13 mg/dL (ref 8–23)
CO2: 23 mmol/L (ref 22–32)
Calcium: 8.1 mg/dL — ABNORMAL LOW (ref 8.9–10.3)
Chloride: 111 mmol/L (ref 98–111)
Creatinine, Ser: 0.96 mg/dL (ref 0.44–1.00)
GFR, Estimated: >60 mL/min (ref >60)
Glucose, Bld: 139 mg/dL — ABNORMAL HIGH (ref 70–99)
Potassium: 3.6 mmol/L (ref 3.5–5.1)
Sodium: 142 mmol/L (ref 135–145)

## 2021-12-31 LAB — CBC
HCT: 38.4 % (ref 36.0–46.0)
Hemoglobin: 12.4 g/dL (ref 12.0–15.0)
MCH: 28.1 pg (ref 26.0–34.0)
MCHC: 32.3 g/dL (ref 30.0–36.0)
MCV: 86.9 fL (ref 80.0–100.0)
Platelets: 211 10*3/uL (ref 150–400)
RBC: 4.42 MIL/uL (ref 3.87–5.11)
RDW: 15.4 % (ref 11.5–15.5)
WBC: 14.6 10*3/uL — ABNORMAL HIGH (ref 4.0–10.5)
nRBC: 0 % (ref 0.0–0.2)

## 2021-12-31 LAB — GLUCOSE, CAPILLARY
Glucose-Capillary: 151 mg/dL — ABNORMAL HIGH (ref 70–99)
Glucose-Capillary: 139 mg/dL — ABNORMAL HIGH (ref 70–99)
Glucose-Capillary: 192 mg/dL — ABNORMAL HIGH (ref 70–99)

## 2021-12-31 LAB — HEMOGLOBIN A1C
Hgb A1c MFr Bld: 7 % — ABNORMAL HIGH (ref 4.8–5.6)
Mean Plasma Glucose: 154 mg/dL

## 2021-12-31 MED ORDER — ORAL CARE MOUTH RINSE: 15.0000 mL | OROMUCOSAL | Status: DC | PRN

## 2022-01-01 DIAGNOSIS — E038 Other specified hypothyroidism: Secondary | ICD-10-CM

## 2022-01-01 DIAGNOSIS — N390 Urinary tract infection, site not specified: Secondary | ICD-10-CM | POA: Diagnosis not present

## 2022-01-01 DIAGNOSIS — N3001 Acute cystitis with hematuria: Secondary | ICD-10-CM | POA: Diagnosis not present

## 2022-01-01 DIAGNOSIS — E1169 Type 2 diabetes mellitus with other specified complication: Secondary | ICD-10-CM | POA: Diagnosis not present

## 2022-01-01 LAB — URINE CULTURE: Culture: <10000 — AB

## 2022-01-01 LAB — GLUCOSE, CAPILLARY: Glucose-Capillary: 175 mg/dL — ABNORMAL HIGH (ref 70–99)

## 2022-01-01 LAB — CBC
HCT: 36.6 % (ref 36.0–46.0)
Hemoglobin: 11.8 g/dL — ABNORMAL LOW (ref 12.0–15.0)
MCH: 28.1 pg (ref 26.0–34.0)
MCHC: 32.2 g/dL (ref 30.0–36.0)
MCV: 87.1 fL (ref 80.0–100.0)
Platelets: 203 10*3/uL (ref 150–400)
RBC: 4.2 MIL/uL (ref 3.87–5.11)
RDW: 15.9 % — ABNORMAL HIGH (ref 11.5–15.5)
WBC: 8.3 10*3/uL (ref 4.0–10.5)
nRBC: 0 % (ref 0.0–0.2)

## 2022-01-01 LAB — BASIC METABOLIC PANEL
Anion gap: 6 (ref 5–15)
BUN: 13 mg/dL (ref 8–23)
CO2: 25 mmol/L (ref 22–32)
Calcium: 8.8 mg/dL — ABNORMAL LOW (ref 8.9–10.3)
Chloride: 109 mmol/L (ref 98–111)
Creatinine, Ser: 1.01 mg/dL — ABNORMAL HIGH (ref 0.44–1.00)
GFR, Estimated: 57 mL/min — ABNORMAL LOW (ref >60)
Glucose, Bld: 162 mg/dL — ABNORMAL HIGH (ref 70–99)
Potassium: 3.6 mmol/L (ref 3.5–5.1)
Sodium: 140 mmol/L (ref 135–145)

## 2022-01-01 LAB — MAGNESIUM: Magnesium: 2.2 mg/dL (ref 1.7–2.4)

## 2022-01-01 MED ORDER — CEFDINIR 300 MG PO CAPS: 300.0000 mg | ORAL_CAPSULE | Freq: Two times a day (BID) | ORAL | 0 refills | Status: AC

## 2022-01-01 NOTE — Progress Notes (Signed)
Chinita Greenland to be D/C'd Home per MD order.  Discussed prescriptions and follow up appointments with the patient. Prescriptions given to patient, medication list explained in detail. Pt verbalized understanding.  Allergies as of 01/01/2022       Reactions   Iodine Hives   Amoxicillin Swelling   Ciprofloxacin    Other reaction(s): Muscle Pain   Hydrochlorothiazide    Other reaction(s): Dizziness Other reaction(s): Dizziness Other reaction(s): Dizziness Other reaction(s): Dizziness Other reaction(s): Dizziness   Penicillin G    Other reaction(s): Angioedema   Sulfa Antibiotics Other (See Comments)   hematuria   Red Dye Hives, Rash        Medication List     TAKE these medications    amLODipine 5 MG tablet Commonly known as: NORVASC Take 5 mg by mouth daily.   benazepril 20 MG tablet Commonly known as: LOTENSIN Take 20 mg by mouth daily.   cefdinir 300 MG capsule Commonly known as: OMNICEF Take 1 capsule (300 mg total) by mouth 2 (two) times daily for 4 days.   cholecalciferol 25 MCG (1000 UNIT) tablet Commonly known as: VITAMIN D3 Take 2,000 Units by mouth daily.   levothyroxine 50 MCG tablet Commonly known as: SYNTHROID Take 50 mcg by mouth daily.   vitamin B-12 50 MCG tablet Commonly known as: CYANOCOBALAMIN Take 1 tablet by mouth daily.        Vitals:   01/01/22 0530 01/01/22 0739  BP: 130/75 (!) 143/70  Pulse: 73 67  Resp: 15 17  Temp: 98.1 F (36.7 C) 98 F (36.7 C)  SpO2: 96% 95%    Skin clean, dry and intact without evidence of skin break down, no evidence of skin tears noted. IV catheter discontinued intact. Site without signs and symptoms of complications. Dressing and pressure applied. Pt denies pain at this time. No complaints noted.  An After Visit Summary was printed and given to the patient. Patient escorted via WC, and D/C home via private auto.  Madie Reno, RN

## 2022-01-01 NOTE — Discharge Summary (Signed)
Physician Discharge Summary  VIDALIA SERPAS PQZ:[id number] DOB: Jun 10, 1946 DOA: 12/30/2021  PCP: Marguerita Merles, MD  Admit date: 12/30/2021 Discharge date: 01/01/2022  Admitted From: home Disposition:  home  Recommendations for Outpatient Follow-up:  Follow up with PCP in 1-2 weeks  Home Health: no Equipment/Devices:  Discharge Condition: stable  CODE STATUS: full  Diet recommendation: Heart Healthy / Carb Modified   Brief/Interim Summary: HPI was taken from Dr. Linda Hedges: Mrs. Splinter, a 76 y/o with hypothyroidism,borderline DM, chronic medial deviation left eye that has been worked up by neuro-ophthalmology and palpation. Three months ago she was diagnosed with a UTI and tx with cipro - she developed myalgia and weakness and stopped medication. She was prescribed doxycycline but never took it as she was doing bettr. About 1 month ago she had a recurrence of dysuria and hematuria. Two weeks ago she developed worsening dysuria, worsening hgematuria and bilateral flank pain. She was seen on the day of admission for dysuria, flank and abdominal pain at her doctor's office. U/A was positive for LE, blood and scant WBCs. CBC revealed a leukocytosis. She was referred to [facility] for evaluation and treatment.     ED Course: afebrile, 103.86 HR 91. Patient in no acute distress at presentation.  Lab: Glucose 242, Cr 1.11, lactic acid 2.6, WBC 20 with 94/2/3. With tachycardia, elevated lactic acid, minimal elevation Cr, keukocytosis and postive U/A code sepsis intiated. She received 1L bolus IVF and was administered Rocphin IV. TRH called to admit for continued elevation and Tx.   As per Dr. Jimmye Norman 01/01/22: Pt was treated for likely UTI w/ IV rocephin. Initial cx grow insignificant growth and repeat urine cx was taken prior to d/c. Pt  was d/c home on po cefdinir to complete the course. Pt was ambulating and transferring independently so PT/OT was not indicated. For more information, please see previous  progress notes.      Discharge Diagnoses:  Principal Problem:   UTI (urinary tract infection) Active Problems:   Severe sepsis (HCC)   DM2 (diabetes mellitus, type 2) (HCC)   Hypothyroidism UTI: w/ hematuria.CT w/ no evidence of urolithiasis, hydronephrosis, or other acute findings. Urine cx shows insignificant growth & repeat urine cx ordered.D/c home on po cefdinir to complete the course.   Severe sepsis: met criteria with tachycardia, leukocytosis, elevated lactic acid & UTI. Resolved.    DM2: well controlled, HbA1c 7.0. Continue on SSI w/ accuchecks    Hypothyroidism: continue on synthroid    Discharge Instructions  Discharge Instructions     Diet - low sodium heart healthy   Complete by: As directed    Diet Carb Modified   Complete by: As directed    Discharge instructions   Complete by: As directed    F/u w/ PCP in 1-2 weeks   Increase activity slowly   Complete by: As directed       Allergies as of 01/01/2022       Reactions   Iodine Hives   Amoxicillin Swelling   Ciprofloxacin    Other reaction(s): Muscle Pain   Hydrochlorothiazide    Other reaction(s): Dizziness Other reaction(s): Dizziness Other reaction(s): Dizziness Other reaction(s): Dizziness Other reaction(s): Dizziness   Penicillin G    Other reaction(s): Angioedema   Sulfa Antibiotics Other (See Comments)   hematuria   Red Dye Hives, Rash        Medication List     TAKE these medications    amLODipine 5 MG tablet Commonly known as: NORVASC  Take 5 mg by mouth daily.   benazepril 20 MG tablet Commonly known as: LOTENSIN Take 20 mg by mouth daily.   cefdinir 300 MG capsule Commonly known as: OMNICEF Take 1 capsule (300 mg total) by mouth 2 (two) times daily for 4 days.   cholecalciferol 25 MCG (1000 UNIT) tablet Commonly known as: VITAMIN D3 Take 2,000 Units by mouth daily.   levothyroxine 50 MCG tablet Commonly known as: SYNTHROID Take 50 mcg by mouth daily.   vitamin  B-12 50 MCG tablet Commonly known as: CYANOCOBALAMIN Take 1 tablet by mouth daily.        Allergies  Allergen Reactions   Iodine Hives   Amoxicillin Swelling   Ciprofloxacin     Other reaction(s): Muscle Pain   Hydrochlorothiazide     Other reaction(s): Dizziness Other reaction(s): Dizziness Other reaction(s): Dizziness Other reaction(s): Dizziness Other reaction(s): Dizziness    Penicillin G     Other reaction(s): Angioedema   Sulfa Antibiotics Other (See Comments)    hematuria   Red Dye Hives and Rash    Consultations:    Procedures/Studies: CT ABDOMEN PELVIS WO CONTRAST  Result Date: 12/30/2021 CLINICAL DATA:  Urosepsis. EXAM: CT ABDOMEN AND PELVIS WITHOUT CONTRAST TECHNIQUE: Multidetector CT imaging of the abdomen and pelvis was performed following the standard protocol without IV contrast. RADIATION DOSE REDUCTION: This exam was performed according to the departmental dose-optimization program which includes automated exposure control, adjustment of the mA and/or kV according to patient size and/or use of iterative reconstruction technique. COMPARISON:  04/14/2005 FINDINGS: Lower chest: No acute findings. Hepatobiliary: No mass visualized on this unenhanced exam. Small cyst is again noted in the inferior right hepatic lobe. Gallbladder is unremarkable. No evidence of biliary ductal dilatation. Pancreas: No mass or inflammatory process visualized on this unenhanced exam. Spleen:  Within normal limits in size. Adrenals/Urinary tract: No evidence of urolithiasis or hydronephrosis. Unremarkable unopacified urinary bladder. Stomach/Bowel: Small hiatal hernia is again seen. No evidence of obstruction, inflammatory process, or abnormal fluid collections. Diverticulosis is seen mainly involving the sigmoid colon, however there is no evidence of diverticulitis. Vascular/Lymphatic: No pathologically enlarged lymph nodes identified. 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 No evidence of abdominal aortic aneurysm. Reproductive:  No mass or other significant abnormality. Other:  None. Musculoskeletal:  No suspicious bone lesions identified. IMPRESSION: No evidence of urolithiasis, hydronephrosis, or other acute findings. Colonic diverticulosis, without radiographic evidence of diverticulitis. 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 Stable small hiatal hernia. Electronically Signed   By: Marlaine Hind M.D.   On: 12/30/2021 15:06   (Echo, Carotid, EGD, Colonoscopy, ERCP)    Subjective: Pt denies any complaints    Discharge Exam: Vitals:   01/01/22 0530 01/01/22 0739  BP: 130/75 (!) 143/70  Pulse: 73 67  Resp: 15 17  Temp: 98.1 F (36.7 C) 98 F (36.7 C)  SpO2: 96% 95%   Vitals:   12/31/21 1607 12/31/21 2054 01/01/22 0530 01/01/22 0739  BP: 126/60 (!) 137/59 130/75 (!) 143/70  Pulse: 72 64 73 67  Resp: 16 18 15 17   Temp: 98.4 F (36.9 C) 98.3 F (36.8 C) 98.1 F (36.7 C) 98 F (36.7 C)  TempSrc: Oral   Oral  SpO2: 98% 97% 96% 95%  Weight:      Height:        General: Pt is alert, awake, not in acute distress Cardiovascular: S1/S2 +, no rubs, no gallops Respiratory: CTA bilaterally, no wheezing, no  rhonchi Abdominal: Soft, NT, obese, bowel sounds + Extremities: no cyanosis    The results of significant diagnostics from this hospitalization (including imaging, microbiology, ancillary and laboratory) are listed below for reference.     Microbiology: Recent Results (from the past 240 hour(s))  Culture, blood (routine x 2)     Status: None (Preliminary result)   Collection Time: 12/30/21 12:07 PM   Specimen: BLOOD LEFT ARM  Result Value Ref Range Status   Specimen Description BLOOD LEFT ARM  Final   Special Requests BAA Blood Culture adequate volume  Final   Culture   Final    NO GROWTH 2 DAYS Performed at [facility], [address], [place], [place] [address]    Report Status PENDING  Incomplete   Culture, blood (routine x 2)     Status: None (Preliminary result)   Collection Time: 12/30/21 12:07 PM   Specimen: BLOOD RIGHT HAND  Result Value Ref Range Status   Specimen Description BLOOD RIGHT HAND  Final   Special Requests BAA Blood Culture adequate volume  Final   Culture   Final    NO GROWTH 2 DAYS Performed at [facility], [address], [place], [place] [address]    Report Status PENDING  Incomplete  Urine Culture     Status: Abnormal   Collection Time: 12/30/21 10:11 PM   Specimen: Urine, Random  Result Value Ref Range Status   Specimen Description   Final    URINE, RANDOM Performed at [facility], [address] [place], [place] [address]    Special Requests   Final    NONE Performed at [facility], [address] [place], [place] [address]    Culture (A)  Final    <10,000 COLONIES/mL INSIGNIFICANT GROWTH Performed at [facility] [address] [place],  [address]    Report Status 01/01/2022 FINAL  Final     Labs: BNP (last 3 results) No results for input(s): "BNP" in the last 8760 hours. Basic Metabolic Panel: Recent Labs  Lab 12/30/21 1100 12/31/21 0419 01/01/22 0338  NA 139 142 140  K 4.0 3.6 3.6  CL 107 111 109  CO2 22 23 25   GLUCOSE 242* 139* 162*  BUN 15 13 13   CREATININE 1.11* 0.96 1.01*  CALCIUM 8.8* 8.1* 8.8*  MG  --   --  2.2   Liver Function Tests: No results for input(s): "AST", "ALT", "ALKPHOS", "BILITOT", "PROT", "ALBUMIN" in the last 168 hours. No results for input(s): "LIPASE", "AMYLASE" in the last 168 hours. No results for input(s): "AMMONIA" in the last 168 hours. CBC: Recent Labs  Lab 12/30/21 1100 12/31/21 0419 01/01/22 0338  WBC 20.0* 14.6* 8.3  NEUTROABS 18.8*  --   --   HGB 13.8 12.4 11.8*  HCT 43.0 38.4 36.6  MCV 86.3 86.9 87.1  PLT 261 211 203   Cardiac Enzymes: No results for input(s): "CKTOTAL", "CKMB", "CKMBINDEX", "TROPONINI" in the last 168  hours. BNP: Invalid input(s): "POCBNP" CBG: Recent Labs  Lab 12/30/21 2120 12/31/21 0802 12/31/21 1151 12/31/21 1626 01/01/22 0802  GLUCAP 136* 151* 139* 192* 175*   D-Dimer No results for input(s): "DDIMER" in the last 72 hours. Hgb A1c Recent Labs    12/30/21 1100  HGBA1C 7.0*   Lipid Profile No results for input(s): "CHOL", "HDL", "LDLCALC", "TRIG", "CHOLHDL", "LDLDIRECT" in the last 72 hours. Thyroid function studies No results for input(s): "TSH", "T4TOTAL", "T3FREE", "THYROIDAB" in the last 72 hours.  Invalid input(s): "FREET3" Anemia work up No results for input(s): "VITAMINB12", "FOLATE", "FERRITIN", "TIBC", "IRON", "RETICCTPCT" in the last 72 hours. Urinalysis No results found for: "COLORURINE", "APPEARANCEUR", "LABSPEC", "PHURINE", "GLUCOSEU", "HGBUR", "BILIRUBINUR", "KETONESUR", "PROTEINUR", "UROBILINOGEN", "NITRITE", "LEUKOCYTESUR" Sepsis Labs Recent Labs  Lab 12/30/21 1100 12/31/21 0419 01/01/22 0338  WBC 20.0* 14.6* 8.3   Microbiology Recent Results (from the past 240 hour(s))  Culture, blood (routine x 2)     Status: None (Preliminary result)   Collection Time: 12/30/21 12:07 PM   Specimen: BLOOD LEFT ARM  Result Value Ref Range Status   Specimen Description BLOOD LEFT ARM  Final   Special Requests BAA Blood Culture adequate volume  Final   Culture   Final    NO GROWTH 2 DAYS Performed at [facility], [address] [place], [place] [address]    Report Status PENDING  Incomplete  Culture, blood (routine x 2)     Status: None (Preliminary result)   Collection Time: 12/30/21 12:07 PM   Specimen: BLOOD RIGHT HAND  Result Value Ref Range Status   Specimen Description BLOOD RIGHT HAND  Final   Special Requests BAA Blood Culture adequate volume  Final   Culture   Final    NO GROWTH 2 DAYS Performed at [facility], [address] [place], [place] [address]    Report Status PENDING  Incomplete  Urine Culture     Status:  Abnormal   Collection Time: 12/30/21 10:11 PM   Specimen: Urine, Random  Result Value Ref Range Status   Specimen Description   Final    URINE, RANDOM Performed at [facility], [address], [place], [place] [address]    Special Requests   Final    NONE Performed at [facility], [address] [place], [place] [address]    Culture (A)  Final    <10,000 COLONIES/mL INSIGNIFICANT GROWTH Performed at [facility], [place] [address] [place], [place] [address]    Report Status 01/01/2022 FINAL  Final     Time coordinating discharge: Over 30 minutes  SIGNED:   Wyvonnia Dusky, MD  [facility] 01/01/2022, 1:01 PM Pager   If 7PM-7AM, please contact night-coverage [email or web address]

## 2022-01-01 NOTE — Progress Notes (Signed)
Mobility Specialist - Progress Note   01/01/22 0900  Mobility  Activity Ambulated independently in hallway  Level of Assistance Independent  Assistive Device None  Distance Ambulated (ft) 500 ft  Activity Response Tolerated well  $Mobility charge 1 Mobility    Clarisa Schools Mobility Specialist 01/01/22, 9:56 AM

## 2022-01-01 NOTE — Plan of Care (Signed)
  Problem: Elimination: Goal: Will not experience complications related to urinary retention Outcome: Progressing   Problem: Pain Managment: Goal: General experience of comfort will improve Outcome: Progressing   Problem: Coping: Goal: Ability to adjust to condition or change in health will improve Outcome: Progressing   

## 2022-01-02 LAB — URINE CULTURE: Culture: <10000 — AB

## 2022-01-04 LAB — CULTURE, BLOOD (ROUTINE X 2)
Culture: NO GROWTH
Culture: NO GROWTH
Special Requests: ADEQUATE
Special Requests: ADEQUATE

## 2023-01-29 ENCOUNTER — Encounter: Payer: Self-pay | Admitting: Emergency Medicine

## 2023-01-29 ENCOUNTER — Emergency Department
Admission: EM | Admit: 2023-01-29 | Discharge: 2023-01-29 | Disposition: A | Discharge status: Home / Self Care | Payer: Medicare Other | Attending: Emergency Medicine | Admitting: Emergency Medicine

## 2023-01-29 ENCOUNTER — Other Ambulatory Visit: Payer: Self-pay

## 2023-01-29 ENCOUNTER — Emergency Department: Payer: Medicare Other

## 2023-01-29 DIAGNOSIS — R000 Tachycardia, unspecified: Secondary | ICD-10-CM | POA: Diagnosis not present

## 2023-01-29 DIAGNOSIS — R1011 Right upper quadrant pain: Secondary | ICD-10-CM | POA: Diagnosis present

## 2023-01-29 DIAGNOSIS — N3000 Acute cystitis without hematuria: Secondary | ICD-10-CM | POA: Diagnosis not present

## 2023-01-29 DIAGNOSIS — E039 Hypothyroidism, unspecified: Secondary | ICD-10-CM | POA: Diagnosis not present

## 2023-01-29 DIAGNOSIS — I10 Essential (primary) hypertension: Secondary | ICD-10-CM | POA: Insufficient documentation

## 2023-01-29 DIAGNOSIS — R7401 Elevation of levels of liver transaminase levels: Secondary | ICD-10-CM | POA: Insufficient documentation

## 2023-01-29 LAB — URINALYSIS, ROUTINE W REFLEX MICROSCOPIC
Bilirubin Urine: NEGATIVE
Glucose, UA: >=500 mg/dL — AB
Hgb urine dipstick: NEGATIVE
Ketones, ur: NEGATIVE mg/dL
Nitrite: POSITIVE — AB
Protein, ur: NEGATIVE mg/dL
Specific Gravity, Urine: 1.023 (ref 1.005–1.030)
WBC, UA: >50 WBC/hpf (ref 0–5)
pH: 5 (ref 5.0–8.0)

## 2023-01-29 LAB — COMPREHENSIVE METABOLIC PANEL
ALT: 101 U/L — ABNORMAL HIGH (ref 0–44)
AST: 251 U/L — ABNORMAL HIGH (ref 15–41)
Albumin: 3.4 g/dL — ABNORMAL LOW (ref 3.5–5.0)
Alkaline Phosphatase: 135 U/L — ABNORMAL HIGH (ref 38–126)
Anion gap: 13 (ref 5–15)
BUN: 11 mg/dL (ref 8–23)
CO2: 21 mmol/L — ABNORMAL LOW (ref 22–32)
Calcium: 8.3 mg/dL — ABNORMAL LOW (ref 8.9–10.3)
Chloride: 100 mmol/L (ref 98–111)
Creatinine, Ser: 1.15 mg/dL — ABNORMAL HIGH (ref 0.44–1.00)
GFR, Estimated: 49 mL/min — ABNORMAL LOW (ref >60)
Glucose, Bld: 317 mg/dL — ABNORMAL HIGH (ref 70–99)
Potassium: 3.9 mmol/L (ref 3.5–5.1)
Sodium: 134 mmol/L — ABNORMAL LOW (ref 135–145)
Total Bilirubin: 2.1 mg/dL — ABNORMAL HIGH (ref 0.3–1.2)
Total Protein: 6.6 g/dL (ref 6.5–8.1)

## 2023-01-29 LAB — CBC WITH DIFFERENTIAL/PLATELET
Abs Immature Granulocytes: 0.06 10*3/uL (ref 0.00–0.07)
Basophils Absolute: 0 10*3/uL (ref 0.0–0.1)
Basophils Relative: 0 %
Eosinophils Absolute: 0 10*3/uL (ref 0.0–0.5)
Eosinophils Relative: 0 %
HCT: 43 % (ref 36.0–46.0)
Hemoglobin: 14.3 g/dL (ref 12.0–15.0)
Immature Granulocytes: 1 %
Lymphocytes Relative: 2 %
Lymphs Abs: 0.2 10*3/uL — ABNORMAL LOW (ref 0.7–4.0)
MCH: 27.3 pg (ref 26.0–34.0)
MCHC: 33.3 g/dL (ref 30.0–36.0)
MCV: 82.2 fL (ref 80.0–100.0)
Monocytes Absolute: 0.3 10*3/uL (ref 0.1–1.0)
Monocytes Relative: 3 %
Neutro Abs: 9.3 10*3/uL — ABNORMAL HIGH (ref 1.7–7.7)
Neutrophils Relative %: 94 %
Platelets: 251 10*3/uL (ref 150–400)
RBC: 5.23 MIL/uL — ABNORMAL HIGH (ref 3.87–5.11)
RDW: 14.7 % (ref 11.5–15.5)
WBC: 10 10*3/uL (ref 4.0–10.5)
nRBC: 0 % (ref 0.0–0.2)

## 2023-01-29 LAB — LIPASE, BLOOD: Lipase: 36 U/L (ref 11–51)

## 2023-01-29 MED ORDER — CEFDINIR 300 MG PO CAPS: 300.0000 mg | ORAL_CAPSULE | Freq: Two times a day (BID) | ORAL | 0 refills | Status: AC

## 2023-01-29 MED ORDER — SODIUM CHLORIDE 0.9 % IV SOLN
2.0000 g | Freq: Once | INTRAVENOUS | Status: AC
  Administered 2023-01-29: 2 g via INTRAVENOUS
  Filled 2023-01-29: qty 20

## 2023-01-29 MED ORDER — GADOBUTROL 1 MMOL/ML IV SOLN
10.0000 mL | Freq: Once | INTRAVENOUS | Status: AC | PRN
  Administered 2023-01-29: 10 mL via INTRAVENOUS

## 2023-01-29 MED ORDER — LACTATED RINGERS IV BOLUS
1000.0000 mL | Freq: Once | INTRAVENOUS | Status: AC
  Administered 2023-01-29: 1000 mL via INTRAVENOUS

## 2023-01-29 MED ORDER — LORAZEPAM 2 MG/ML IJ SOLN
0.5000 mg | Freq: Once | INTRAMUSCULAR | Status: AC
  Administered 2023-01-29: 0.5 mg via INTRAVENOUS
  Filled 2023-01-29: qty 1

## 2023-01-29 NOTE — ED Notes (Signed)
MRI updated on pt, pending call for MRI, meds ordered for claustrophobia, VSS. Husband at [facility].

## 2023-01-29 NOTE — ED Provider Notes (Signed)
[facility] Provider Note    Event Date/Time   First MD Initiated Contact with Patient 01/29/23 0507     (approximate)   History   Flank Pain   HPI  Megan Sheppard is a 77 y.o. female who presents to the ED for evaluation of Flank Pain   I review of medical DC summary from 1 year ago.  Morbidly obese patient was admitted for pyelonephritis at that time.  History of HTN and hypothyroidism.  Patient presents with her husband for evaluation of chronic back, flank and leg discomfort and acute urinary frequency, chills and nausea.  Reports multiple months of right sided flank and back pain, seen by her PCP, urgent care and told it was muscles.  She denies any falls, trauma or injuries.  Ambulating at her baseline.  She reports for the past week or so she has had increased urinary frequency.  Tonight she has had chills and nausea without emesis.  No abdominal pain, stool changes.  Reports somewhat lesser p.o. intake of solids with nausea but no emesis.   Physical Exam   Triage Vital Signs: ED Triage Vitals [01/29/23 0506]  Encounter Vitals Group     BP      Systolic BP Percentile      Diastolic BP Percentile      Pulse      Resp      Temp      Temp src      SpO2      Weight 205 lb (93 kg)     Height 5' (1.524 m)     Head Circumference      Peak Flow      Pain Score      Pain Loc      Pain Education      Exclude from Growth Chart     Most recent vital signs: Vitals:   01/29/23 0508 01/29/23 0545  BP: (!) 145/91   Pulse: (!) 118 (!) 106  Resp: 20   Temp: 99.7 F (37.6 C)   SpO2: 100%     General: Awake, no distress.  Obese CV:  Good peripheral perfusion.  Tachycardic and regular Resp:  Normal effort.  Abd:  No distention.  Soft and benign throughout, minimal suprapubic and RUQ tenderness is present MSK:  No deformity noted.  Neuro:  No focal deficits appreciated. Other:     ED Results / Procedures / Treatments   Labs (all labs  ordered are listed, but only abnormal results are displayed) Labs Reviewed  CBC WITH DIFFERENTIAL/PLATELET - Abnormal; Notable for the following components:      Result Value   RBC 5.23 (*)    Neutro Abs 9.3 (*)    Lymphs Abs 0.2 (*)    All other components within normal limits  COMPREHENSIVE METABOLIC PANEL - Abnormal; Notable for the following components:   Sodium 134 (*)    CO2 21 (*)    Glucose, Bld 317 (*)    Creatinine, Ser 1.15 (*)    Calcium 8.3 (*)    Albumin 3.4 (*)    AST 251 (*)    ALT 101 (*)    Alkaline Phosphatase 135 (*)    Total Bilirubin 2.1 (*)    GFR, Estimated 49 (*)    All other components within normal limits  URINALYSIS, ROUTINE W REFLEX MICROSCOPIC - Abnormal; Notable for the following components:   Color, Urine YELLOW (*)    APPearance HAZY (*)  Glucose, UA >=500 (*)    Nitrite POSITIVE (*)    Leukocytes,Ua SMALL (*)    Bacteria, UA RARE (*)    Non Squamous Epithelial PRESENT (*)    All other components within normal limits  URINE CULTURE  LIPASE, BLOOD    EKG   RADIOLOGY pending  Official radiology report(s): No results found.  PROCEDURES and INTERVENTIONS:  Procedures  Medications  cefTRIAXone (ROCEPHIN) 2 g in sodium chloride 0.9 % 100 mL IVPB (2 g Intravenous New Bag/Given 01/29/23 0627)  lactated ringers bolus 1,000 mL (1,000 mLs Intravenous New Bag/Given 01/29/23 0531)     IMPRESSION / MDM / ASSESSMENT AND PLAN / ED COURSE  I reviewed the triage vital signs and the nursing notes.  Differential diagnosis includes, but is not limited to, MSK pain, UTI, pyelonephritis, biliary colic  {Patient presents with symptoms of an acute illness or injury that is potentially life-threatening.  77 year old woman presents to the ED with acute urinary symptoms superimposed on chronic intermittent right flank symptoms.  Her tachycardia is noted but she has no other SIRS criteria to indicate sepsis protocols at this point.  Neutrophilia is  noted but no leukocytosis.  Normal lipase.  Urine with infectious features, will be sent for culture and she is started on ceftriaxone.  Transaminitis is noted and her chronic right-sided symptoms are possibly biliary colic in nature.  RUQ ultrasound is pending.  Choledocholithiasis is a possibility and she may require MRCP, pending her RUQ ultrasound. Signed out to oncoming provider  Clinical Course as of 01/29/23 0631  Thu Jan 29, 2023  0545 LFTs noted.  Will add lipase and obtain RUQ ultrasound [DS]  5188 Updated patient and reassessed [DS]  0604 Updated patient of UTI.  Ultrasound in the room for RUQ study [DS]    Clinical Course User Index [DS] Delton Prairie, MD     FINAL CLINICAL IMPRESSION(S) / ED DIAGNOSES   Final diagnoses:  Acute cystitis without hematuria  Transaminitis     Rx / DC Orders   ED Discharge Orders     None        Note:  This document was prepared using Dragon voice recognition software and may include unintentional dictation errors.   Delton Prairie, MD 01/29/23 [phone]

## 2023-01-29 NOTE — Discharge Instructions (Addendum)
Follow up with your pcp for recheck of liver enzymes

## 2023-01-29 NOTE — ED Triage Notes (Signed)
Pt presents ambulatory to triage via POV with complaints of R sided flank pain x 2 months. Endorses frequent urination - no hematuria nor fevers. A&Ox4 at this time. Denies CP or SOB.

## 2023-01-29 NOTE — ED Notes (Signed)
Back from MRI, alert, NAD, calm, no changes, IVF complete, husband at [facility], denies questions.

## 2023-01-29 NOTE — ED Notes (Signed)
IVF paused, pt to MRI, alert, NAD, calm, interactive.

## 2023-01-29 NOTE — ED Provider Notes (Signed)
MRCP is overall assuring, patient reports she has tolerated cefdinir in the past, prescription provided, appropriate for outpatient follow-up, strict return precautions discussed   Jene Every, MD 01/29/23 1151

## 2023-03-18 ENCOUNTER — Ambulatory Visit: Payer: Medicare Other | Admitting: Urology

## 2023-03-18 VITALS — BP 157/72 | HR 99 | Ht 60.0 in | Wt 205.0 lb

## 2023-03-18 DIAGNOSIS — Z87440 Personal history of urinary (tract) infections: Secondary | ICD-10-CM | POA: Diagnosis not present

## 2023-03-18 DIAGNOSIS — Z09 Encounter for follow-up examination after completed treatment for conditions other than malignant neoplasm: Secondary | ICD-10-CM | POA: Diagnosis not present

## 2023-03-18 DIAGNOSIS — N390 Urinary tract infection, site not specified: Secondary | ICD-10-CM

## 2023-03-18 LAB — MICROSCOPIC EXAMINATION: Epithelial Cells (non renal): >10 /HPF — AB (ref 0–10)

## 2023-03-18 LAB — URINALYSIS, COMPLETE
Bilirubin, UA: NEGATIVE
Bilirubin, UA: NEGATIVE
Ketones, UA: NEGATIVE
Ketones, UA: NEGATIVE
Leukocytes,UA: NEGATIVE
Nitrite, UA: NEGATIVE
Nitrite, UA: NEGATIVE
Protein,UA: NEGATIVE
RBC, UA: NEGATIVE
Specific Gravity, UA: 1.02 (ref 1.005–1.030)
Specific Gravity, UA: 1.02 (ref 1.005–1.030)
Urobilinogen, Ur: 0.2 mg/dL (ref 0.2–1.0)
Urobilinogen, Ur: 0.2 mg/dL (ref 0.2–1.0)
pH, UA: 5 (ref 5.0–7.5)
pH, UA: 5 (ref 5.0–7.5)

## 2023-03-18 NOTE — Progress Notes (Signed)
I, Duke Salvia, acting as a Neurosurgeon for Riki Altes, MD.,have documented all relevant documentation on the behalf of Riki Altes, MD, as directed by  Riki Altes, MD while in the presence of Riki Altes, MD.  03/18/2023 11:29 AM   Megan Sheppard Jun 28, 1946 [id number]  Referring provider: No referring provider defined for this encounter.  Chief Complaint  Patient presents with   Recurrent UTI    HPI: Megan Sheppard is a 77 y.o. female referred for evaluation of recurrent UTI.  Complains of recurrent UTIs over the last 3 months. Typical symptoms include urinary frequency, nocturia, and dysuria. One episode of hematuria approximately 1 week ago. She states antibiotics help her symptoms while taking it, but they recur once she completes treatment. Recent record review over the last year, she has had multiple urine cultures, all growing multiple species. Her urinalysis has shown significant vaginal contamination with abundant squamous epithelial cells. She was hospitalized June 2023 for sepsis suspected from a urinary source and urinalysis at that visit also had insignificant growth and blood cultures were negative. CT abdomen/pelvis June 2023 showed no hydronephrosis or urinary calculi. She also had an MRI of the abdomen 01/29/23, which showed normal appearing kidneys without hydronephrosis or mass.   PMH: Past Medical History:  Diagnosis Date   CKD (chronic kidney disease), stage II    Hematuria    Hypertension    Hypothyroidism    Myalgia    Osteoporosis    Thyroid disease    Vitamin B12 deficiency    Vitamin D deficiency     Surgical History: Past Surgical History:  Procedure Laterality Date   ANKLE SURGERY Right     Home Medications:  Allergies as of 03/18/2023       Reactions   Iodine Hives   Amoxicillin Swelling   Ciprofloxacin    Other reaction(s): Muscle Pain   Hydrochlorothiazide    Other reaction(s): Dizziness Other reaction(s):  Dizziness Other reaction(s): Dizziness Other reaction(s): Dizziness Other reaction(s): Dizziness   Penicillin G    Other reaction(s): Angioedema   Sulfa Antibiotics Other (See Comments)   hematuria   Red Dye #40 (allura Red) Hives, Rash        Medication List        Accurate as of March 18, 2023 11:29 AM. If you have any questions, ask your nurse or doctor.          amLODipine 5 MG tablet Commonly known as: NORVASC Take 5 mg by mouth daily.   benazepril 20 MG tablet Commonly known as: LOTENSIN Take 20 mg by mouth daily.   cholecalciferol 25 MCG (1000 UNIT) tablet Commonly known as: VITAMIN D3 Take 2,000 Units by mouth daily.   levothyroxine 50 MCG tablet Commonly known as: SYNTHROID Take 50 mcg by mouth daily.   vitamin B-12 50 MCG tablet Commonly known as: CYANOCOBALAMIN Take 1 tablet by mouth daily.        Allergies:  Allergies  Allergen Reactions   Iodine Hives   Amoxicillin Swelling   Ciprofloxacin     Other reaction(s): Muscle Pain   Hydrochlorothiazide     Other reaction(s): Dizziness Other reaction(s): Dizziness Other reaction(s): Dizziness Other reaction(s): Dizziness Other reaction(s): Dizziness    Penicillin G     Other reaction(s): Angioedema   Sulfa Antibiotics Other (See Comments)    hematuria   Red Dye #40 (Allura Red) Hives and Rash    Family History: Family History  Problem Relation Age  of Onset   Breast cancer Sister     Social History:  has no history on file for tobacco use, alcohol use, and drug use.   Physical Exam: BP (!) 157/72   Pulse 99   Ht 5' (1.524 m)   Wt 205 lb (93 kg)   BMI 40.04 kg/m   Constitutional:  Alert and oriented, No acute distress. HEENT: [place] AT, moist mucus membranes.  Trachea midline, no masses. Cardiovascular: No clubbing, cyanosis, or edema. Respiratory: Normal respiratory effort, no increased work of breathing. GI: Abdomen is soft, nontender, nondistended, no abdominal  masses Skin: No rashes, bruises or suspicious lesions. Neurologic: Grossly intact, no focal deficits, moving all 4 extremities. Psychiatric: Normal mood and affect.    Laboratory Data: Urinalysis  2+glucose/trace blood/ 1+leukocytes Microscopy 11-30 WBC/>10 epi/many bacteria    Assessment & Plan:    1. Recurrent UTI Does not meet AUA criteria for recurrent UTIs of women, secondary to lack of culture documentation. Recent urinalysis, including today's, have shown significant vaginal epithelial contamination, which may be affecting her urine cultures. Cath urine obtained today for repeat UA and urine culture. With prior episode gross hematuria, scheduled cystoscopy. We discissed potential preventative measures, including cranberry, D-mannose, and low dose vaginal estrogen, as well as low dose antibiotic prophylaxis.      [facility] [address], [address] [place], [place] [address] [phone]

## 2023-03-18 NOTE — Progress Notes (Signed)
In and Out Catheterization  Patient is present today for a I & O catheterization due to Dirty catch . Patient was cleaned and prepped in a sterile fashion with betadine . A 14FR cath was inserted no complications were noted , 90ml of urine return was noted, urine was yellow in color. A clean urine sample was collected for Urine sample. Bladder was drained  And catheter was removed with out difficulty.    Performed by: Ples Specter CMA

## 2023-03-19 ENCOUNTER — Encounter: Payer: Self-pay | Admitting: Urology

## 2023-03-21 LAB — CULTURE, URINE COMPREHENSIVE

## 2023-04-16 ENCOUNTER — Other Ambulatory Visit: Payer: BLUE CROSS/BLUE SHIELD | Admitting: Urology

## 2023-05-08 DIAGNOSIS — N12 Tubulo-interstitial nephritis, not specified as acute or chronic: Secondary | ICD-10-CM

## 2023-05-08 DIAGNOSIS — A419 Sepsis, unspecified organism: Secondary | ICD-10-CM

## 2023-05-08 HISTORY — DX: Sepsis, unspecified organism: A41.9

## 2023-05-08 HISTORY — DX: Tubulo-interstitial nephritis, not specified as acute or chronic: N12

## 2023-05-17 ENCOUNTER — Emergency Department: Payer: Medicare Other

## 2023-05-17 ENCOUNTER — Other Ambulatory Visit: Payer: Self-pay

## 2023-05-17 ENCOUNTER — Inpatient Hospital Stay
Admission: EM | Admit: 2023-05-17 | Discharge: 2023-05-20 | DRG: 872 | Disposition: A | Discharge status: Home / Self Care | Payer: Medicare Other | Attending: Internal Medicine | Admitting: Internal Medicine

## 2023-05-17 DIAGNOSIS — Z79890 Hormone replacement therapy (postmenopausal): Secondary | ICD-10-CM

## 2023-05-17 DIAGNOSIS — N950 Postmenopausal bleeding: Secondary | ICD-10-CM | POA: Diagnosis present

## 2023-05-17 DIAGNOSIS — Z66 Do not resuscitate: Secondary | ICD-10-CM | POA: Diagnosis present

## 2023-05-17 DIAGNOSIS — Z888 Allergy status to other drugs, medicaments and biological substances status: Secondary | ICD-10-CM | POA: Diagnosis not present

## 2023-05-17 DIAGNOSIS — Z6838 Body mass index (BMI) 38.0-38.9, adult: Secondary | ICD-10-CM

## 2023-05-17 DIAGNOSIS — I10 Essential (primary) hypertension: Secondary | ICD-10-CM | POA: Insufficient documentation

## 2023-05-17 DIAGNOSIS — Z881 Allergy status to other antibiotic agents status: Secondary | ICD-10-CM

## 2023-05-17 DIAGNOSIS — Z880 Allergy status to penicillin: Secondary | ICD-10-CM | POA: Diagnosis not present

## 2023-05-17 DIAGNOSIS — M810 Age-related osteoporosis without current pathological fracture: Secondary | ICD-10-CM | POA: Diagnosis present

## 2023-05-17 DIAGNOSIS — A419 Sepsis, unspecified organism: Principal | ICD-10-CM | POA: Diagnosis present

## 2023-05-17 DIAGNOSIS — Z803 Family history of malignant neoplasm of breast: Secondary | ICD-10-CM | POA: Diagnosis not present

## 2023-05-17 DIAGNOSIS — E119 Type 2 diabetes mellitus without complications: Secondary | ICD-10-CM

## 2023-05-17 DIAGNOSIS — E1122 Type 2 diabetes mellitus with diabetic chronic kidney disease: Secondary | ICD-10-CM | POA: Diagnosis present

## 2023-05-17 DIAGNOSIS — Z23 Encounter for immunization: Secondary | ICD-10-CM | POA: Diagnosis present

## 2023-05-17 DIAGNOSIS — Z882 Allergy status to sulfonamides status: Secondary | ICD-10-CM

## 2023-05-17 DIAGNOSIS — E039 Hypothyroidism, unspecified: Secondary | ICD-10-CM | POA: Diagnosis present

## 2023-05-17 DIAGNOSIS — Z8049 Family history of malignant neoplasm of other genital organs: Secondary | ICD-10-CM

## 2023-05-17 DIAGNOSIS — A4181 Sepsis due to Enterococcus: Principal | ICD-10-CM | POA: Diagnosis present

## 2023-05-17 DIAGNOSIS — Z87440 Personal history of urinary (tract) infections: Secondary | ICD-10-CM | POA: Diagnosis not present

## 2023-05-17 DIAGNOSIS — N12 Tubulo-interstitial nephritis, not specified as acute or chronic: Secondary | ICD-10-CM | POA: Diagnosis not present

## 2023-05-17 DIAGNOSIS — Z79899 Other long term (current) drug therapy: Secondary | ICD-10-CM

## 2023-05-17 DIAGNOSIS — E669 Obesity, unspecified: Secondary | ICD-10-CM | POA: Diagnosis present

## 2023-05-17 DIAGNOSIS — N182 Chronic kidney disease, stage 2 (mild): Secondary | ICD-10-CM | POA: Diagnosis present

## 2023-05-17 DIAGNOSIS — Z91041 Radiographic dye allergy status: Secondary | ICD-10-CM

## 2023-05-17 DIAGNOSIS — Z794 Long term (current) use of insulin: Secondary | ICD-10-CM

## 2023-05-17 DIAGNOSIS — I129 Hypertensive chronic kidney disease with stage 1 through stage 4 chronic kidney disease, or unspecified chronic kidney disease: Secondary | ICD-10-CM | POA: Diagnosis present

## 2023-05-17 DIAGNOSIS — R6520 Severe sepsis without septic shock: Secondary | ICD-10-CM | POA: Diagnosis present

## 2023-05-17 DIAGNOSIS — N10 Acute tubulo-interstitial nephritis: Secondary | ICD-10-CM | POA: Diagnosis present

## 2023-05-17 DIAGNOSIS — R310 Gross hematuria: Secondary | ICD-10-CM | POA: Diagnosis not present

## 2023-05-17 LAB — BASIC METABOLIC PANEL
Anion gap: 8 (ref 5–15)
BUN: 12 mg/dL (ref 8–23)
CO2: 23 mmol/L (ref 22–32)
Calcium: 8.3 mg/dL — ABNORMAL LOW (ref 8.9–10.3)
Chloride: 103 mmol/L (ref 98–111)
Creatinine, Ser: 1.12 mg/dL — ABNORMAL HIGH (ref 0.44–1.00)
GFR, Estimated: 50 mL/min — ABNORMAL LOW (ref >60)
Glucose, Bld: 197 mg/dL — ABNORMAL HIGH (ref 70–99)
Potassium: 4.2 mmol/L (ref 3.5–5.1)
Sodium: 134 mmol/L — ABNORMAL LOW (ref 135–145)

## 2023-05-17 LAB — CBC
HCT: 48.5 % — ABNORMAL HIGH (ref 36.0–46.0)
Hemoglobin: 15.8 g/dL — ABNORMAL HIGH (ref 12.0–15.0)
MCH: 28.2 pg (ref 26.0–34.0)
MCHC: 32.6 g/dL (ref 30.0–36.0)
MCV: 86.6 fL (ref 80.0–100.0)
Platelets: 303 10*3/uL (ref 150–400)
RBC: 5.6 MIL/uL — ABNORMAL HIGH (ref 3.87–5.11)
RDW: 15 % (ref 11.5–15.5)
WBC: 15.9 10*3/uL — ABNORMAL HIGH (ref 4.0–10.5)
nRBC: 0 % (ref 0.0–0.2)

## 2023-05-17 LAB — URINALYSIS, ROUTINE W REFLEX MICROSCOPIC
Bilirubin Urine: NEGATIVE
Glucose, UA: NEGATIVE mg/dL
Ketones, ur: NEGATIVE mg/dL
Nitrite: POSITIVE — AB
Protein, ur: 30 mg/dL — AB
RBC / HPF: >50 RBC/hpf (ref 0–5)
Specific Gravity, Urine: 1.008 (ref 1.005–1.030)
WBC, UA: >50 WBC/hpf (ref 0–5)
pH: 5 (ref 5.0–8.0)

## 2023-05-17 LAB — HEMOGLOBIN A1C
Hgb A1c MFr Bld: 8.3 % — ABNORMAL HIGH (ref 4.8–5.6)
Mean Plasma Glucose: 191.51 mg/dL

## 2023-05-17 LAB — HEPATIC FUNCTION PANEL
ALT: 21 U/L (ref 0–44)
AST: 25 U/L (ref 15–41)
Albumin: 3.5 g/dL (ref 3.5–5.0)
Alkaline Phosphatase: 79 U/L (ref 38–126)
Bilirubin, Direct: 0.3 mg/dL — ABNORMAL HIGH (ref 0.0–0.2)
Indirect Bilirubin: 0.5 mg/dL (ref 0.3–0.9)
Total Bilirubin: 0.8 mg/dL (ref <1.2)
Total Protein: 6.3 g/dL — ABNORMAL LOW (ref 6.5–8.1)

## 2023-05-17 LAB — LACTIC ACID, PLASMA
Lactic Acid, Venous: 2.4 mmol/L (ref 0.5–1.9)
Lactic Acid, Venous: 1.8 mmol/L (ref 0.5–1.9)

## 2023-05-17 LAB — PROCALCITONIN: Procalcitonin: 0.51 ng/mL

## 2023-05-17 LAB — GLUCOSE, CAPILLARY
Glucose-Capillary: 200 mg/dL — ABNORMAL HIGH (ref 70–99)
Glucose-Capillary: 152 mg/dL — ABNORMAL HIGH (ref 70–99)

## 2023-05-17 LAB — LIPASE, BLOOD: Lipase: 33 U/L (ref 11–51)

## 2023-05-17 MED ORDER — ACETAMINOPHEN 500 MG PO TABS
1000.0000 mg | ORAL_TABLET | Freq: Four times a day (QID) | ORAL | Status: DC
  Administered 2023-05-17 – 2023-05-20 (×4): 1000 mg via ORAL
  Filled 2023-05-17 (×8): qty 2

## 2023-05-17 MED ORDER — ONDANSETRON HCL 4 MG/2ML IJ SOLN
4.0000 mg | Freq: Four times a day (QID) | INTRAMUSCULAR | Status: DC | PRN
Start: 2023-05-17 — End: 2023-05-20

## 2023-05-17 MED ORDER — SODIUM CHLORIDE 0.9 % IV SOLN
2.0000 g | INTRAVENOUS | Status: DC
  Administered 2023-05-18 – 2023-05-20 (×3): 2 g via INTRAVENOUS
  Filled 2023-05-17 (×3): qty 20

## 2023-05-17 MED ORDER — SODIUM CHLORIDE 0.9 % IV SOLN
1.0000 g | INTRAVENOUS | Status: AC
  Administered 2023-05-17: 1 g via INTRAVENOUS
  Filled 2023-05-17: qty 10

## 2023-05-17 MED ORDER — INSULIN ASPART 100 UNIT/ML IJ SOLN
0.0000 [IU] | Freq: Three times a day (TID) | INTRAMUSCULAR | Status: DC
  Administered 2023-05-17: 2 [IU] via SUBCUTANEOUS
  Administered 2023-05-18: 1 [IU] via SUBCUTANEOUS
  Administered 2023-05-18: 2 [IU] via SUBCUTANEOUS
  Administered 2023-05-18: 1 [IU] via SUBCUTANEOUS
  Administered 2023-05-19 (×2): 2 [IU] via SUBCUTANEOUS
  Administered 2023-05-19: 1 [IU] via SUBCUTANEOUS
  Administered 2023-05-20: 2 [IU] via SUBCUTANEOUS
  Administered 2023-05-20: 1 [IU] via SUBCUTANEOUS
  Filled 2023-05-17 (×9): qty 1

## 2023-05-17 MED ORDER — LACTATED RINGERS IV SOLN
150.0000 mL/h | INTRAVENOUS | Status: AC
  Administered 2023-05-17 – 2023-05-18 (×4): 150 mL/h via INTRAVENOUS

## 2023-05-17 MED ORDER — INSULIN ASPART 100 UNIT/ML IJ SOLN
3.0000 [IU] | Freq: Three times a day (TID) | INTRAMUSCULAR | Status: DC
  Administered 2023-05-17 – 2023-05-20 (×9): 3 [IU] via SUBCUTANEOUS
  Filled 2023-05-17 (×9): qty 1

## 2023-05-17 MED ORDER — PNEUMOCOCCAL 20-VAL CONJ VACC 0.5 ML IM SUSY
0.5000 mL | PREFILLED_SYRINGE | INTRAMUSCULAR | Status: AC
  Administered 2023-05-18: 0.5 mL via INTRAMUSCULAR
  Filled 2023-05-17: qty 0.5

## 2023-05-17 MED ORDER — ONDANSETRON HCL 4 MG PO TABS: 4.0000 mg | ORAL_TABLET | Freq: Four times a day (QID) | ORAL | Status: DC | PRN

## 2023-05-17 MED ORDER — IBUPROFEN 400 MG PO TABS
400.0000 mg | ORAL_TABLET | Freq: Once | ORAL | Status: AC
  Administered 2023-05-17: 400 mg via ORAL
  Filled 2023-05-17: qty 1

## 2023-05-17 MED ORDER — LACTATED RINGERS IV BOLUS
1000.0000 mL | Freq: Once | INTRAVENOUS | Status: AC
  Administered 2023-05-17: 1000 mL via INTRAVENOUS

## 2023-05-17 NOTE — ED Notes (Signed)
Per Fanny Bien, MD order and communication to RN. Rn to hang LR bolus and then recheck lactic

## 2023-05-17 NOTE — ED Triage Notes (Signed)
Pt comes with blood and blood clots in her urine. Pt states back pain . Pt states nausea. Pt states this all started yesterday morning. Pt states hx of kidney infection. Pt states she took AZO pills with little relief.

## 2023-05-17 NOTE — Assessment & Plan Note (Signed)
UA indicative of infection w/ flank pain,hematuria, malaise at home Meeting sepsis criteria  CT grossly stable  IV rocephin  Urine culture  Follow

## 2023-05-17 NOTE — Assessment & Plan Note (Signed)
 Cont synthroid

## 2023-05-17 NOTE — Progress Notes (Signed)
CODE SEPSIS - PHARMACY COMMUNICATION  **Broad Spectrum Antibiotics should be administered within 1 hour of Sepsis diagnosis**  Time Code Sepsis Called/Page Received: 8119  Antibiotics Ordered: Ceftriaxone  Time of 1st antibiotic administration: 1000  Additional action taken by pharmacy: N/A  Tressie Ellis 05/17/2023  10:28 AM

## 2023-05-17 NOTE — ED Notes (Signed)
Pt walked to bathroom and voided, back in bed.

## 2023-05-17 NOTE — ED Provider Notes (Signed)
[facility] Provider Note    Event Date/Time   First MD Initiated Contact with Patient 05/17/23 [phone]     (approximate)   History   Hematuria   HPI  Megan Sheppard is a 77 y.o. female has a history of urinary tract infections, sepsis, hypothyroidism and diabetes   Patient noticed a couple days ago a discomfort in her right mid back and flank area.  She reports her sort of sharp discomfort and burning feeling.  Is persisted and along with that she started noticing blood in her urine and a feeling of a bit of lightheadedness today.  She felt a little bit like she might of had a fever yesterday but could not check it at home  Mild nausea no vomiting.  No chest pain or shortness of breath no headache.  She reports symptoms are sort of reminiscent of when she was in the hospital with possible "sepsis" but a year ago  Physical Exam   Triage Vital Signs: ED Triage Vitals [05/17/23 0835]  Encounter Vitals Group     BP (!) 153/119     Systolic BP Percentile      Diastolic BP Percentile      Pulse Rate (!) 112     Resp 18     Temp 98 F (36.7 C)     Temp src      SpO2 98 %     Weight 198 lb (89.8 kg)     Height 5' (1.524 m)     Head Circumference      Peak Flow      Pain Score 7     Pain Loc      Pain Education      Exclude from Growth Chart     Most recent vital signs: Vitals:   05/17/23 1205 05/17/23 1226  BP: 104/70   Pulse:    Resp:    Temp:  97.9 F (36.6 C)  SpO2:       General: Awake, no distress.  CV:  Good peripheral perfusion.  Slight tachycardia Resp:  Normal effort.  Clear bilateral Abd:  No distention.  Soft nontender nondistended throughout No costovertebral angle tenderness on the left Reports tenderness and reproduction of right flank pain with percussion of the right costovertebral angle Other:     ED Results / Procedures / Treatments   Labs (all labs ordered are listed, but only abnormal results are  displayed) Labs Reviewed  URINALYSIS, ROUTINE W REFLEX MICROSCOPIC - Abnormal; Notable for the following components:      Result Value   Color, Urine AMBER (*)    APPearance CLOUDY (*)    Hgb urine dipstick LARGE (*)    Protein, ur 30 (*)    Nitrite POSITIVE (*)    Leukocytes,Ua MODERATE (*)    Bacteria, UA FEW (*)    All other components within normal limits  CBC - Abnormal; Notable for the following components:   WBC 15.9 (*)    RBC 5.60 (*)    Hemoglobin 15.8 (*)    HCT 48.5 (*)    All other components within normal limits  BASIC METABOLIC PANEL - Abnormal; Notable for the following components:   Sodium 134 (*)    Glucose, Bld 197 (*)    Creatinine, Ser 1.12 (*)    Calcium 8.3 (*)    GFR, Estimated 50 (*)    All other components within normal limits  HEPATIC FUNCTION PANEL - Abnormal; Notable for  the following components:   Total Protein 6.3 (*)    Bilirubin, Direct 0.3 (*)    All other components within normal limits  LACTIC ACID, PLASMA - Abnormal; Notable for the following components:   Lactic Acid, Venous 2.4 (*)    All other components within normal limits  URINE CULTURE  CULTURE, BLOOD (ROUTINE X 2)  CULTURE, BLOOD (ROUTINE X 2)  LIPASE, BLOOD  LACTIC ACID, PLASMA     RADIOLOGY  CT imaging interpreted by me as grossly negative for pyelonephritis or obvious gross abnormality  CT Renal Stone Study  Result Date: 05/17/2023 CLINICAL DATA:  Right flank pain and hematuria. EXAM: CT ABDOMEN AND PELVIS WITHOUT CONTRAST TECHNIQUE: Multidetector CT imaging of the abdomen and pelvis was performed following the standard protocol without IV contrast. RADIATION DOSE REDUCTION: This exam was performed according to the departmental dose-optimization program which includes automated exposure control, adjustment of the mA and/or kV according to patient size and/or use of iterative reconstruction technique. COMPARISON:  CT abdomen and pelvis dated 12/30/2021. FINDINGS: Lower  chest: No acute abnormality. Hepatobiliary: Benign hepatic cysts are redemonstrated. No imaging follow-up is recommended for this finding. No gallstones, gallbladder wall thickening, or biliary dilatation. Pancreas: Unremarkable. No pancreatic ductal dilatation or surrounding inflammatory changes. Spleen: Normal in size without focal abnormality. Adrenals/Urinary Tract: Adrenal glands are unremarkable. Kidneys are normal, without renal calculi, focal lesion, or hydronephrosis. Bladder is unremarkable. Stomach/Bowel: There is a small hiatal hernia. Appendix appears normal. There is colonic diverticulosis without evidence of diverticulitis. No evidence of bowel wall thickening, distention, or inflammatory changes. 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 No enlarged abdominal or pelvic lymph nodes. Reproductive: Uterus and bilateral adnexa are unremarkable. Other: No abdominal wall hernia or abnormality. No abdominopelvic ascites. Musculoskeletal: Degenerative changes are seen in the spine. IMPRESSION: 1. No acute findings in the abdomen or pelvis. No evidence of nephrolithiasis or hydronephrosis. Electronically Signed   By: Romona Curls M.D.   On: 05/17/2023 10:06      PROCEDURES:  Critical Care performed: Yes, see critical care procedure note(s)  CRITICAL CARE Performed by: Sharyn Creamer   Total critical care time: 30 minutes  Critical care time was exclusive of separately billable procedures and treating other patients.  Critical care was necessary to treat or prevent imminent or life-threatening deterioration.  Critical care was time spent personally by me on the following activities: development of treatment plan with patient and/or surrogate as well as nursing, discussions with consultants, evaluation of patient's response to treatment, examination of patient, obtaining history from patient or surrogate, ordering and performing treatments and  interventions, ordering and review of laboratory studies, ordering and review of radiographic studies, pulse oximetry and re-evaluation of patient's condition.   Procedures   MEDICATIONS ORDERED IN ED: Medications  acetaminophen (TYLENOL) tablet 1,000 mg (1,000 mg Oral Given 05/17/23 0957)  cefTRIAXone (ROCEPHIN) 2 g in sodium chloride 0.9 % 100 mL IVPB (has no administration in time range)  ibuprofen (ADVIL) tablet 400 mg (400 mg Oral Given 05/17/23 0944)  cefTRIAXone (ROCEPHIN) 1 g in sodium chloride 0.9 % 100 mL IVPB (0 g Intravenous Stopped 05/17/23 1058)  lactated ringers bolus 1,000 mL (1,000 mLs Intravenous New Bag/Given 05/17/23 1056)     IMPRESSION / MDM / ASSESSMENT AND PLAN / ED COURSE  I reviewed the triage vital signs and the nursing notes.  Differential diagnosis includes, but is not limited to, pyelonephritis, nephrolithiasis, renal mass, renal lesion, ischemia, gallbladder disease pancreatitis, etc.  Based on the patient's right flank pain and then developing hematuria feeling of chills and fatigue I am quite concerned about pyelonephritis by clinical assessment.  Will evaluate further with renal imaging without contrast to possibly identify if the patient might have a stone obstruction or other grossly apparent process.  Patient's urinalysis concerning for infection sent for culture.  Elevated white count.  Mild tachycardia.  She does not have a fever but based on her clinical history previous history we will start with Rocephin and I suspect she may have evidence of sepsis at this point.  She does meet sepsis criteria.  She does not have evidence of severe sepsis or shock physiology.  Patient's presentation is most consistent with acute presentation with potential threat to life or bodily function.   The patient is on the cardiac monitor to evaluate for evidence of arrhythmia and/or significant heart rate changes.   Updated patient and her  family of results of CT as well as urine and concern for infection.  Patient and family agreeable and understanding of plan for antibiotic therapy and admission to the hospital for concerns of developing sepsis.  Further care and treatment anticipated with hospital service  Consulted with and patient accepted to hospitalist by Dr. Alvester Morin     FINAL CLINICAL IMPRESSION(S) / ED DIAGNOSES   Final diagnoses:  Sepsis, due to unspecified organism, unspecified whether acute organ dysfunction present [facility])  Pyelonephritis  Hematuria, gross     Rx / DC Orders   ED Discharge Orders     None        Note:  This document was prepared using Dragon voice recognition software and may include unintentional dictation errors.   Sharyn Creamer, MD 05/17/23 [phone]

## 2023-05-17 NOTE — Plan of Care (Signed)
  Problem: Fluid Volume: Goal: Hemodynamic stability will improve Outcome: Progressing   Problem: Clinical Measurements: Goal: Diagnostic test results will improve Outcome: Progressing Goal: Signs and symptoms of infection will decrease Outcome: Progressing   Problem: Respiratory: Goal: Ability to maintain adequate ventilation will improve Outcome: Progressing   Problem: Education: Goal: Knowledge of General Education information will improve Description: Including pain rating scale, medication(s)/side effects and non-pharmacologic comfort measures Outcome: Progressing   Problem: Health Behavior/Discharge Planning: Goal: Ability to manage health-related needs will improve Outcome: Progressing   Problem: Clinical Measurements: Goal: Ability to maintain clinical measurements within normal limits will improve Outcome: Progressing Goal: Will remain free from infection Outcome: Progressing Goal: Diagnostic test results will improve Outcome: Progressing Goal: Respiratory complications will improve Outcome: Progressing Goal: Cardiovascular complication will be avoided Outcome: Progressing   Problem: Activity: Goal: Risk for activity intolerance will decrease Outcome: Progressing   Problem: Nutrition: Goal: Adequate nutrition will be maintained Outcome: Progressing   Problem: Coping: Goal: Level of anxiety will decrease Outcome: Progressing   Problem: Elimination: Goal: Will not experience complications related to bowel motility Outcome: Progressing Goal: Will not experience complications related to urinary retention Outcome: Progressing   Problem: Pain Management: Goal: General experience of comfort will improve Outcome: Progressing   Problem: Safety: Goal: Ability to remain free from injury will improve Outcome: Progressing   Problem: Skin Integrity: Goal: Risk for impaired skin integrity will decrease Outcome: Progressing   Problem: Education: Goal:  Ability to describe self-care measures that may prevent or decrease complications (Diabetes Survival Skills Education) will improve Outcome: Progressing Goal: Individualized Educational Video(s) Outcome: Progressing   Problem: Coping: Goal: Ability to adjust to condition or change in health will improve Outcome: Progressing   Problem: Fluid Volume: Goal: Ability to maintain a balanced intake and output will improve Outcome: Progressing   Problem: Health Behavior/Discharge Planning: Goal: Ability to identify and utilize available resources and services will improve Outcome: Progressing Goal: Ability to manage health-related needs will improve Outcome: Progressing   Problem: Metabolic: Goal: Ability to maintain appropriate glucose levels will improve Outcome: Progressing   Problem: Nutritional: Goal: Maintenance of adequate nutrition will improve Outcome: Progressing Goal: Progress toward achieving an optimal weight will improve Outcome: Progressing   Problem: Skin Integrity: Goal: Risk for impaired skin integrity will decrease Outcome: Progressing   Problem: Tissue Perfusion: Goal: Adequacy of tissue perfusion will improve Outcome: Progressing

## 2023-05-17 NOTE — H&P (Signed)
History and Physical    Patient: Megan Sheppard ZOX:[id number] DOB: 1945/10/21 DOA: 05/17/2023 DOS: the patient was seen and examined on 05/17/2023 PCP: Leanna Sato, MD  Patient coming from: Home  Chief Complaint:  Chief Complaint  Patient presents with   Hematuria   HPI: Megan Sheppard is a 77 y.o. female with medical history significant of hypertension, hypothyroidism, type 2 diabetes, obesity presenting with sepsis, pyelonephritis.  Patient reports acute onset of severe right-sided flank pain hematuria past 24 hours.  Positive nausea.  Noted similar symptoms associated with UTI pyelonephritis in remote past. No focal hemiparesis or confusion. No diarrhea. Mild headache. + decreased po intake.  Presented to ER afebrile, HR in 100s, RR in mid 20s. BP stable. Satting well on RA. WBC 16, hgb 16, plt 303. Glu 190s, Cr 1.1. UA indicative of infection. Lactate 2.4-->1.8. CT renal stone study stable.  Review of Systems: As mentioned in the history of present illness. All other systems reviewed and are negative. Past Medical History:  Diagnosis Date   CKD (chronic kidney disease), stage II    Hematuria    Hypertension    Hypothyroidism    Myalgia    Osteoporosis    Thyroid disease    Vitamin B12 deficiency    Vitamin D deficiency    Past Surgical History:  Procedure Laterality Date   ANKLE SURGERY Right    Social History:  has no history on file for tobacco use, alcohol use, and drug use.  Allergies  Allergen Reactions   Iodine Hives   Amoxicillin Swelling   Ciprofloxacin     Other reaction(s): Muscle Pain   Hydrochlorothiazide     Other reaction(s): Dizziness Other reaction(s): Dizziness Other reaction(s): Dizziness Other reaction(s): Dizziness Other reaction(s): Dizziness    Penicillin G     Other reaction(s): Angioedema   Sulfa Antibiotics Other (See Comments)    hematuria   Red Dye #40 (Allura Red) Hives and Rash    Family History  Problem Relation Age of  Onset   Breast cancer Sister     Prior to Admission medications   Medication Sig Start Date End Date Taking? Authorizing Provider  benazepril (LOTENSIN) 20 MG tablet Take 20 mg by mouth daily.   Yes [provider]  cholecalciferol (VITAMIN D3) 25 MCG (1000 UNIT) tablet Take 2,000 Units by mouth daily.   Yes [provider]  levothyroxine (SYNTHROID) 50 MCG tablet Take 50 mcg by mouth daily.   Yes [provider]  vitamin B-12 (CYANOCOBALAMIN) 50 MCG tablet Take 1 tablet by mouth daily. 08/06/21  Yes [provider]  amLODipine (NORVASC) 5 MG tablet Take 5 mg by mouth daily. Patient not taking: Reported on 05/17/2023    [provider]    Physical Exam: Vitals:   05/17/23 1226 05/17/23 1230 05/17/23 1330 05/17/23 1345  BP:  105/61 (!) 107/57   Pulse:  73 68 74  Resp:  17 13 18   Temp: 97.9 F (36.6 C)     TempSrc: Oral     SpO2:  100% 100% 99%  Weight:      Height:       Physical Exam Constitutional:      Appearance: She is obese.  HENT:     Head: Normocephalic and atraumatic.     Nose: Nose normal.     Mouth/Throat:     Mouth: Mucous membranes are moist.  Eyes:     Pupils: Pupils are equal, round, and reactive to light.  Cardiovascular:     Rate and Rhythm: Normal rate and regular rhythm.  Pulmonary:     Effort: Pulmonary effort is normal.  Abdominal:     General: Abdomen is flat. Bowel sounds are normal.     Comments: + R sided flank pain   Musculoskeletal:        General: Normal range of motion.  Skin:    General: Skin is warm.  Neurological:     General: No focal deficit present.  Psychiatric:        Mood and Affect: Mood normal.     Data Reviewed:  There are no new results to review at this time.  CT Renal Stone Study CLINICAL DATA:  Right flank pain and hematuria.  EXAM: CT ABDOMEN AND PELVIS WITHOUT CONTRAST  TECHNIQUE: Multidetector CT imaging of the abdomen and pelvis was performed following the  standard protocol without IV contrast.  RADIATION DOSE REDUCTION: This exam was performed according to the departmental dose-optimization program which includes automated exposure control, adjustment of the mA and/or kV according to patient size and/or use of iterative reconstruction technique.  COMPARISON:  CT abdomen and pelvis dated 12/30/2021.  FINDINGS: Lower chest: No acute abnormality.  Hepatobiliary: Benign hepatic cysts are redemonstrated. No imaging follow-up is recommended for this finding. No gallstones, gallbladder wall thickening, or biliary dilatation.  Pancreas: Unremarkable. No pancreatic ductal dilatation or surrounding inflammatory changes.  Spleen: Normal in size without focal abnormality.  Adrenals/Urinary Tract: Adrenal glands are unremarkable. Kidneys are normal, without renal calculi, focal lesion, or hydronephrosis. Bladder is unremarkable.  Stomach/Bowel: There is a small hiatal hernia. Appendix appears normal. There is colonic diverticulosis without evidence of diverticulitis. No evidence of bowel wall thickening, distention, or inflammatory changes.  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 No enlarged abdominal or pelvic lymph nodes.  Reproductive: Uterus and bilateral adnexa are unremarkable.  Other: No abdominal wall hernia or abnormality. No abdominopelvic ascites.  Musculoskeletal: Degenerative changes are seen in the spine.  IMPRESSION: 1. No acute findings in the abdomen or pelvis. No evidence of nephrolithiasis or hydronephrosis.  Electronically Signed   By: Romona Curls M.D.   On: 05/17/2023 10:06  Lab Results  Component Value Date   WBC 15.9 (H) 05/17/2023   HGB 15.8 (H) 05/17/2023   HCT 48.5 (H) 05/17/2023   MCV 86.6 05/17/2023   PLT 303 05/17/2023   Last metabolic panel Lab Results  Component Value Date   GLUCOSE 197 (H) 05/17/2023   NA 134 (L) 05/17/2023   K 4.2  05/17/2023   CL 103 05/17/2023   CO2 23 05/17/2023   BUN 12 05/17/2023   CREATININE 1.12 (H) 05/17/2023   GFRNONAA 50 (L) 05/17/2023   CALCIUM 8.3 (L) 05/17/2023   PROT 6.3 (L) 05/17/2023   ALBUMIN 3.5 05/17/2023   BILITOT 0.8 05/17/2023   ALKPHOS 79 05/17/2023   AST 25 05/17/2023   ALT 21 05/17/2023   ANIONGAP 8 05/17/2023    Assessment and Plan: * Pyelonephritis UA indicative of infection w/ flank pain,hematuria, malaise at home Meeting sepsis criteria  CT grossly stable  IV rocephin  Urine culture  Follow    Severe sepsis (HCC) Meeting sepsis criteria w/ HR 100s, RR 20s.  WBC 16  Lactate 2.4  Noted urinary source- UA indicative of infection w/ flank pain,hematuria, malaise at home Pancultured in the ER  IV rocephin  LR IVF  Trend lactate  Monitor    DM2 (diabetes mellitus, type 2) (HCC) Blood sugar  190s  SSi  A1C   HTN (hypertension) BP stable  Titrate home regimen   Hypothyroidism Cont synthroid       Advance Care Planning:   Code Status: Limited: Do not attempt resuscitation (DNR) -DNR-LIMITED -Do Not Intubate/DNI    Consults: None   Family Communication: Husband at the bedside   Severity of Illness: The appropriate patient status for this patient is INPATIENT. Inpatient status is judged to be reasonable and necessary in order to provide the required intensity of service to ensure the patient's safety. The patient's presenting symptoms, physical exam findings, and initial radiographic and laboratory data in the context of their chronic comorbidities is felt to place them at high risk for further clinical deterioration. Furthermore, it is not anticipated that the patient will be medically stable for discharge from the hospital within 2 midnights of admission.   * I certify that at the point of admission it is my clinical judgment that the patient will require inpatient hospital care spanning beyond 2 midnights from the point of admission due to high  intensity of service, high risk for further deterioration and high frequency of surveillance required.*  Author: Floydene Flock, MD 05/17/2023 2:20 PM  For on call review [email or web address].

## 2023-05-17 NOTE — Assessment & Plan Note (Signed)
Blood sugar 190s  SSi  A1C

## 2023-05-17 NOTE — Sepsis Progress Note (Signed)
Elink following code sepsis °

## 2023-05-17 NOTE — Assessment & Plan Note (Signed)
Meeting sepsis criteria w/ HR 100s, RR 20s.  WBC 16  Lactate 2.4  Noted urinary source- UA indicative of infection w/ flank pain,hematuria, malaise at home Pancultured in the ER  IV rocephin  LR IVF  Trend lactate  Monitor

## 2023-05-17 NOTE — Assessment & Plan Note (Signed)
BP stable Titrate home regimen 

## 2023-05-18 DIAGNOSIS — N12 Tubulo-interstitial nephritis, not specified as acute or chronic: Secondary | ICD-10-CM | POA: Diagnosis not present

## 2023-05-18 LAB — COMPREHENSIVE METABOLIC PANEL
ALT: 19 U/L (ref 0–44)
AST: 17 U/L (ref 15–41)
Albumin: 2.8 g/dL — ABNORMAL LOW (ref 3.5–5.0)
Alkaline Phosphatase: 66 U/L (ref 38–126)
Anion gap: 5 (ref 5–15)
BUN: 13 mg/dL (ref 8–23)
CO2: 23 mmol/L (ref 22–32)
Calcium: 8.3 mg/dL — ABNORMAL LOW (ref 8.9–10.3)
Chloride: 113 mmol/L — ABNORMAL HIGH (ref 98–111)
Creatinine, Ser: 0.96 mg/dL (ref 0.44–1.00)
GFR, Estimated: >60 mL/min (ref >60)
Glucose, Bld: 162 mg/dL — ABNORMAL HIGH (ref 70–99)
Potassium: 4.2 mmol/L (ref 3.5–5.1)
Sodium: 137 mmol/L (ref 135–145)
Total Bilirubin: <0.2 mg/dL (ref <1.2)
Total Protein: 5.4 g/dL — ABNORMAL LOW (ref 6.5–8.1)

## 2023-05-18 LAB — CBC
HCT: 38.6 % (ref 36.0–46.0)
Hemoglobin: 12.7 g/dL (ref 12.0–15.0)
MCH: 28 pg (ref 26.0–34.0)
MCHC: 32.9 g/dL (ref 30.0–36.0)
MCV: 85.2 fL (ref 80.0–100.0)
Platelets: 204 10*3/uL (ref 150–400)
RBC: 4.53 MIL/uL (ref 3.87–5.11)
RDW: 14.9 % (ref 11.5–15.5)
WBC: 8.9 10*3/uL (ref 4.0–10.5)
nRBC: 0 % (ref 0.0–0.2)

## 2023-05-18 LAB — GLUCOSE, CAPILLARY
Glucose-Capillary: 141 mg/dL — ABNORMAL HIGH (ref 70–99)
Glucose-Capillary: 179 mg/dL — ABNORMAL HIGH (ref 70–99)
Glucose-Capillary: 125 mg/dL — ABNORMAL HIGH (ref 70–99)
Glucose-Capillary: 157 mg/dL — ABNORMAL HIGH (ref 70–99)

## 2023-05-18 MED ORDER — ORAL CARE MOUTH RINSE: 15.0000 mL | OROMUCOSAL | Status: DC | PRN

## 2023-05-18 NOTE — Progress Notes (Signed)
Patient reports she is still having a lot of blood in her urine. MD aware.

## 2023-05-18 NOTE — Plan of Care (Signed)
  Problem: Activity: Goal: Risk for activity intolerance will decrease Outcome: Progressing   

## 2023-05-18 NOTE — Progress Notes (Signed)
Progress Note   Patient: Megan Sheppard [id number] DOB: 11-Sep-1945 DOA: 05/17/2023     1 DOS: the patient was seen and examined on 05/18/2023   Brief hospital course: Megan Sheppard is a 77 y.o. female with medical history significant of hypertension, hypothyroidism, type 2 diabetes, obesity presenting with sepsis, pyelonephritis.  Patient reports acute onset of severe right-sided flank pain hematuria past 24 hours.  Positive nausea.  Noted similar symptoms associated with UTI pyelonephritis in remote past. No focal hemiparesis or confusion. No diarrhea. Mild headache. + decreased po intake.  Presented to ER afebrile, HR in 100s, RR in mid 20s. BP stable. Satting well on RA. WBC 16, hgb 16, plt 303. Glu 190s, Cr 1.1. UA indicative of infection. Lactate 2.4-->1.8. CT renal stone study stable.   Assessment and Plan:  Acute right-sided pyelonephritis UA indicative of infection w/ flank pain,hematuria, malaise at home Meeting sepsis criteria  I have reviewed patient's CT scan of the abdomen that is unremarkable Continue current antibiotics Follow-up urine culture results We will continue to monitor hematuria I have ordered repeat hemoglobin, we will follow     Severe sepsis (HCC) Meeting sepsis criteria w/ HR 100s, RR 20s. WBC 16, Lactate 2.4  Continue management as above     DM2 (diabetes mellitus, type 2) (HCC) Follow-up on A1c Monitor blood glucose Continue current insulin therapy   HTN (hypertension) Monitor blood pressure BP is soft I will hold off antihypertensives at this time   Hypothyroidism Continue Synthroid      Advance Care Planning:   Code Status: Limited: Do not attempt resuscitation (DNR) -DNR-LIMITED -Do Not Intubate/DNI     Consults: None    Family Communication: Discussed with patient's husband at bedside  Subjective:  Patient seen and examined at bedside this morning Denies nausea vomiting chest pain or abdominal pain Did have some bleeding noted  in her urine Abdominal imaging unremarkable  Physical Exam: HENT:     Head: Normocephalic and atraumatic.     Nose: Nose normal.     Mouth/Throat:     Mouth: Mucous membranes are moist.  Eyes:     Pupils: Pupils are equal, round, and reactive to light.  Cardiovascular:     Rate and Rhythm: Normal rate and regular rhythm.  Pulmonary:     Effort: Pulmonary effort is normal.  Abdominal: Right-sided flank pain improved Musculoskeletal:        General: Normal range of motion.  Skin:    General: Skin is warm.  Neurological:     General: No focal deficit present.  Psychiatric:        Mood and Affect: Mood normal.   Vitals:   05/17/23 1630 05/17/23 1924 05/18/23 0330 05/18/23 0918  BP: 102/60 112/62 (!) 115/46 128/60  Pulse: 70 76 63 66  Resp: 18 18 18 18   Temp: 97.8 F (36.6 C) 97.9 F (36.6 C) 97.7 F (36.5 C) 97.6 F (36.4 C)  TempSrc:  Oral Oral Oral  SpO2: 97% 98% 98% 100%  Weight:      Height:        Data Reviewed: I have reviewed patient's CT scan of the abdomen that did not show any calculi or hydronephrosis    Latest Ref Rng & Units 05/18/2023    4:06 AM 05/17/2023    8:36 AM 01/29/2023    5:09 AM  BMP  Glucose 70 - 99 mg/dL 644  034  742   BUN 8 - 23 mg/dL 13  12  11   Creatinine 0.44 - 1.00 mg/dL 1.61  0.96  0.45   Sodium 135 - 145 mmol/L 137  134  134   Potassium 3.5 - 5.1 mmol/L 4.2  4.2  3.9   Chloride 98 - 111 mmol/L 113  103  100   CO2 22 - 32 mmol/L 23  23  21    Calcium 8.9 - 10.3 mg/dL 8.3  8.3  8.3        Latest Ref Rng & Units 05/18/2023    4:06 AM 05/17/2023    8:36 AM 01/29/2023    5:09 AM  CBC  WBC 4.0 - 10.5 K/uL 8.9  15.9  10.0   Hemoglobin 12.0 - 15.0 g/dL 40.9  81.1  91.4   Hematocrit 36.0 - 46.0 % 38.6  48.5  43.0   Platelets 150 - 400 K/uL 204  303  251       Author: Loyce Dys, MD 05/18/2023 4:20 PM  For on call review [email or web address].

## 2023-05-19 DIAGNOSIS — N12 Tubulo-interstitial nephritis, not specified as acute or chronic: Secondary | ICD-10-CM | POA: Diagnosis not present

## 2023-05-19 DIAGNOSIS — A419 Sepsis, unspecified organism: Secondary | ICD-10-CM | POA: Diagnosis not present

## 2023-05-19 DIAGNOSIS — R310 Gross hematuria: Secondary | ICD-10-CM

## 2023-05-19 LAB — BASIC METABOLIC PANEL
Anion gap: 7 (ref 5–15)
BUN: 13 mg/dL (ref 8–23)
CO2: 25 mmol/L (ref 22–32)
Calcium: 8.5 mg/dL — ABNORMAL LOW (ref 8.9–10.3)
Chloride: 107 mmol/L (ref 98–111)
Creatinine, Ser: 0.91 mg/dL (ref 0.44–1.00)
GFR, Estimated: >60 mL/min (ref >60)
Glucose, Bld: 147 mg/dL — ABNORMAL HIGH (ref 70–99)
Potassium: 4.2 mmol/L (ref 3.5–5.1)
Sodium: 139 mmol/L (ref 135–145)

## 2023-05-19 LAB — CBC WITH DIFFERENTIAL/PLATELET
Abs Immature Granulocytes: 0.05 10*3/uL (ref 0.00–0.07)
Basophils Absolute: 0.1 10*3/uL (ref 0.0–0.1)
Basophils Relative: 1 %
Eosinophils Absolute: 0.1 10*3/uL (ref 0.0–0.5)
Eosinophils Relative: 2 %
HCT: 46.5 % — ABNORMAL HIGH (ref 36.0–46.0)
Hemoglobin: 15.1 g/dL — ABNORMAL HIGH (ref 12.0–15.0)
Immature Granulocytes: 1 %
Lymphocytes Relative: 29 %
Lymphs Abs: 2.4 10*3/uL (ref 0.7–4.0)
MCH: 27.9 pg (ref 26.0–34.0)
MCHC: 32.5 g/dL (ref 30.0–36.0)
MCV: 86 fL (ref 80.0–100.0)
Monocytes Absolute: 0.6 10*3/uL (ref 0.1–1.0)
Monocytes Relative: 7 %
Neutro Abs: 4.9 10*3/uL (ref 1.7–7.7)
Neutrophils Relative %: 60 %
Platelets: 241 10*3/uL (ref 150–400)
RBC: 5.41 MIL/uL — ABNORMAL HIGH (ref 3.87–5.11)
RDW: 15 % (ref 11.5–15.5)
WBC: 8.1 10*3/uL (ref 4.0–10.5)
nRBC: 0 % (ref 0.0–0.2)

## 2023-05-19 LAB — GLUCOSE, CAPILLARY
Glucose-Capillary: 153 mg/dL — ABNORMAL HIGH (ref 70–99)
Glucose-Capillary: 198 mg/dL — ABNORMAL HIGH (ref 70–99)
Glucose-Capillary: 139 mg/dL — ABNORMAL HIGH (ref 70–99)
Glucose-Capillary: 163 mg/dL — ABNORMAL HIGH (ref 70–99)

## 2023-05-19 LAB — URINE CULTURE: Culture: 10000 — AB

## 2023-05-19 NOTE — Progress Notes (Addendum)
Progress Note   Patient: Megan Sheppard [id number] DOB: 1945-10-13 DOA: 05/17/2023     2 DOS: the patient was seen and examined on 05/19/2023    Subjective:  Patient seen and examined at bedside this morning Denies nausea vomiting or abdominal pain Still complaining of having hematuria with clots Urologist have been consulted  Brief hospital course: Megan Sheppard is a 77 y.o. female with medical history significant of hypertension, hypothyroidism, type 2 diabetes, obesity presenting with sepsis, pyelonephritis.  Patient reports acute onset of severe right-sided flank pain hematuria past 24 hours.  Positive nausea.  Noted similar symptoms associated with UTI with  pyelonephritis in remote past.  According to her as she followed up with urologist 3 months ago and was being planned for cystoscopy however she felt better and so she canceled her appointment.  Currently being managed for sepsis with right-sided pyelonephritis as well as gross hematuria, urology is consulted   Assessment and Plan:   Severe sepsis secondary to acute right-sided pyelonephritis UA indicative of infection w/ flank pain,hematuria, malaise at home Meeting sepsis criteria w/ HR 100s, RR 20s. WBC 16, Lactate 2.4  I have reviewed patient's CT scan of the abdomen that is unremarkable Continue current antibiotics Follow-up urine culture results We will continue to monitor hematuria I have ordered repeat hemoglobin, we will follow   Gross hematuria of unclear etiology CT scan of the abdomen did not show any calculi or hydronephrosis or masses. Urologist Dr. Lonna Cobb consulted and pending recommendations Follow-up with urologist recommendation Monitor CBC closely     DM2 (diabetes mellitus, type 2) (HCC) Follow-up on A1c Monitor blood glucose Continue current insulin therapy   HTN (hypertension) Monitor blood pressure BP is soft I will hold off antihypertensives at this time   Hypothyroidism Continue  Synthroid      Advance Care Planning:   Code Status: Limited: Do not attempt resuscitation (DNR) -DNR-LIMITED -Do Not Intubate/DNI     Consults: None    Family Communication: Discussed with patient's husband at bedside     Physical Exam: HENT:     Head: Normocephalic and atraumatic.     Nose: Nose normal.     Mouth/Throat:     Mouth: Mucous membranes are moist.  Eyes:     Pupils: Pupils are equal, round, and reactive to light.  Cardiovascular:     Rate and Rhythm: Normal rate and regular rhythm.  Pulmonary:     Effort: Pulmonary effort is normal.  Abdominal: Right-sided flank pain improved Musculoskeletal:        General: Normal range of motion.  Skin:    General: Skin is warm.  Neurological:     General: No focal deficit present.  Psychiatric:        Mood and Affect: Mood normal.      Data Reviewed: I have reviewed patient's CT scan of the abdomen that did not show any calculi or hydronephrosis     Latest Ref Rng & Units 05/19/2023    4:30 AM 05/18/2023    4:06 AM 05/17/2023    8:36 AM  CBC  WBC 4.0 - 10.5 K/uL 8.1  8.9  15.9   Hemoglobin 12.0 - 15.0 g/dL 30.8  65.7  84.6   Hematocrit 36.0 - 46.0 % 46.5  38.6  48.5   Platelets 150 - 400 K/uL 241  204  303        Latest Ref Rng & Units 05/19/2023    4:30 AM 05/18/2023    4:06  AM 05/17/2023    8:36 AM  BMP  Glucose 70 - 99 mg/dL 604  540  981   BUN 8 - 23 mg/dL 13  13  12    Creatinine 0.44 - 1.00 mg/dL 1.91  4.78  2.95   Sodium 135 - 145 mmol/L 139  137  134   Potassium 3.5 - 5.1 mmol/L 4.2  4.2  4.2   Chloride 98 - 111 mmol/L 107  113  103   CO2 22 - 32 mmol/L 25  23  23    Calcium 8.9 - 10.3 mg/dL 8.5  8.3  8.3     Vitals:   05/18/23 1705 05/18/23 2031 05/19/23 0305 05/19/23 0817  BP: 95/60 122/66 126/66 136/66  Pulse: 78 74 74 64  Resp: 18 16 16 18   Temp:  (!) 97.4 F (36.3 C) 97.6 F (36.4 C) 97.7 F (36.5 C)  TempSrc:  Oral  Oral  SpO2: 100% 98% 100% 96%  Weight:      Height:          Author: Loyce Dys, MD 05/19/2023 11:39 AM  For on call review [email or web address].   Addendum Culture results discussed with ID pharmacist At this point culture results Suspicion for contaminant given that patient has improved on current antibiotics.  According to ID pharmacist this is unlikely to be the causative organism for her UTI and so he recommends continuation of current antibiotics.

## 2023-05-19 NOTE — Plan of Care (Signed)
Alert and oriented x4. Ambulatory with stand-by assistance to bathroom. Patient with concerns about vaginal bleeding and hematuria.  Patient reassured and updated medical team about patient's concerns.   Problem: Fluid Volume: Goal: Hemodynamic stability will improve Outcome: Progressing   Problem: Clinical Measurements: Goal: Diagnostic test results will improve Outcome: Progressing Goal: Signs and symptoms of infection will decrease Outcome: Progressing   Problem: Education: Goal: Knowledge of General Education information will improve Description: Including pain rating scale, medication(s)/side effects and non-pharmacologic comfort measures Outcome: Progressing   Problem: Health Behavior/Discharge Planning: Goal: Ability to manage health-related needs will improve Outcome: Progressing

## 2023-05-19 NOTE — Progress Notes (Signed)
Pt still having blood in urine, states she is having clots, instructed to save urine next time going to the BR. Also instructed to inform Md when rounding today

## 2023-05-19 NOTE — Consult Note (Signed)
Urology Consult  I have been asked to see the patient by Dr. Meriam Sprague, for evaluation and management of gross hematuria.  Chief Complaint: Gross hematuria, right flank pain  History of Present Illness: Megan Sheppard is a 77 y.o. year old female with PMH DM2 admitted on 05/17/2023 with sepsis due to right pyelonephritis and gross hematuria.  Admission CT stone study with no nephrolithiasis or hydronephrosis.  No evidence of clot retention.  Admission urine culture is growing low colony counts of E faecalis.  Blood cultures pending with no growth at 2 days.  On antibiotics as below.  Hemoglobin stable at 15.1.  Creatinine stable at 0.91.  Bladder scan normal at 72 mL per nursing.  She is accompanied today by her husband at the bedside.  She saw Dr. Lonna Cobb earlier this year for evaluation of possible recurrent UTIs.  Cystoscopy was recommended, however she canceled it because her symptoms resolved.  She reports she is not sure if her bleeding is only in her urine, as she is feeling blood clots passing without sensations of urinary urgency as she is sitting in bed.  She has a history of heavy menstrual bleeding causing anemia but has not had any bleeding since menopause at age 35.  She still has her uterus.  Anti-infectives (From admission, onward)    Start     Dose/Rate Route Frequency Ordered Stop   05/18/23 0500  cefTRIAXone (ROCEPHIN) 2 g in sodium chloride 0.9 % 100 mL IVPB        2 g 200 mL/hr over 30 Minutes Intravenous Every 24 hours 05/17/23 1101     05/17/23 1000  cefTRIAXone (ROCEPHIN) 1 g in sodium chloride 0.9 % 100 mL IVPB        1 g 200 mL/hr over 30 Minutes Intravenous STAT 05/17/23 0946 05/17/23 1058       Past Medical History:  Diagnosis Date   CKD (chronic kidney disease), stage II    Hematuria    Hypertension    Hypothyroidism    Myalgia    Osteoporosis    Thyroid disease    Vitamin B12 deficiency    Vitamin D deficiency     Past Surgical History:   Procedure Laterality Date   ANKLE SURGERY Right     Home Medications:  Current Meds  Medication Sig   benazepril (LOTENSIN) 20 MG tablet Take 20 mg by mouth daily.   cholecalciferol (VITAMIN D3) 25 MCG (1000 UNIT) tablet Take 2,000 Units by mouth daily.   levothyroxine (SYNTHROID) 50 MCG tablet Take 50 mcg by mouth daily.   vitamin B-12 (CYANOCOBALAMIN) 50 MCG tablet Take 1 tablet by mouth daily.    Allergies:  Allergies  Allergen Reactions   Iodine Hives   Amoxicillin Swelling   Ciprofloxacin     Other reaction(s): Muscle Pain   Hydrochlorothiazide     Other reaction(s): Dizziness Other reaction(s): Dizziness Other reaction(s): Dizziness Other reaction(s): Dizziness Other reaction(s): Dizziness    Penicillin G     Other reaction(s): Angioedema   Sulfa Antibiotics Other (See Comments)    hematuria   Red Dye #40 (Allura Red) Hives and Rash    Family History  Problem Relation Age of Onset   Breast cancer Sister     Social History:  reports that she has never smoked. She has never used smokeless tobacco. No history on file for alcohol use and drug use.  ROS: A complete review of systems was performed.  All systems are negative except for  pertinent findings as noted.  Physical Exam:  Vital signs in last 24 hours: Temp:  [97.4 F (36.3 C)-97.7 F (36.5 C)] 97.7 F (36.5 C) (11/12 0817) Pulse Rate:  [64-78] 64 (11/12 0817) Resp:  [16-18] 18 (11/12 0817) BP: (95-136)/(60-66) 136/66 (11/12 0817) SpO2:  [96 %-100 %] 96 % (11/12 0817) Constitutional:  Alert and oriented, no acute distress HEENT: [place] AT, moist mucus membranes Cardiovascular: No clubbing, cyanosis, or edema Respiratory: Normal respiratory effort Skin: No rashes, bruises or suspicious lesions Neurologic: Grossly intact, no focal deficits, moving all 4 extremities Psychiatric: Normal mood and affect  Laboratory Data:  Recent Labs    05/17/23 0836 05/18/23 0406 05/19/23 0430  WBC 15.9* 8.9 8.1   HGB 15.8* 12.7 15.1*  HCT 48.5* 38.6 46.5*   Recent Labs    05/17/23 0836 05/18/23 0406 05/19/23 0430  NA 134* 137 139  K 4.2 4.2 4.2  CL 103 113* 107  CO2 23 23 25   GLUCOSE 197* 162* 147*  BUN 12 13 13   CREATININE 1.12* 0.96 0.91  CALCIUM 8.3* 8.3* 8.5*   Urinalysis    Component Value Date/Time   COLORURINE AMBER (A) 05/17/2023 0836   APPEARANCEUR CLOUDY (A) 05/17/2023 0836   APPEARANCEUR Clear 03/18/2023 1028   LABSPEC 1.008 05/17/2023 0836   PHURINE 5.0 05/17/2023 0836   GLUCOSEU NEGATIVE 05/17/2023 0836   HGBUR LARGE (A) 05/17/2023 0836   BILIRUBINUR NEGATIVE 05/17/2023 0836   BILIRUBINUR Negative 03/18/2023 1028   KETONESUR NEGATIVE 05/17/2023 0836   PROTEINUR 30 (A) 05/17/2023 0836   NITRITE POSITIVE (A) 05/17/2023 0836   LEUKOCYTESUR MODERATE (A) 05/17/2023 0836   Results for orders placed or performed during the hospital encounter of 05/17/23  Urine Culture     Status: Abnormal   Collection Time: 05/17/23  8:44 AM   Specimen: Urine, Clean Catch  Result Value Ref Range Status   Specimen Description   Final    URINE, CLEAN CATCH Performed at [facility], [address] [place], [place] [address]    Special Requests   Final    NONE Performed at [facility], [address] [place], [place] [address]    Culture 10,000 COLONIES/mL ENTEROCOCCUS FAECALIS (A)  Final   Report Status 05/19/2023 FINAL  Final   Organism ID, Bacteria ENTEROCOCCUS FAECALIS (A)  Final      Susceptibility   Enterococcus faecalis - MIC*    AMPICILLIN <=2 SENSITIVE Sensitive     NITROFURANTOIN <=16 SENSITIVE Sensitive     VANCOMYCIN 1 SENSITIVE Sensitive     * 10,000 COLONIES/mL ENTEROCOCCUS FAECALIS  Blood Culture (routine x 2)     Status: None (Preliminary result)   Collection Time: 05/17/23  9:43 AM   Specimen: BLOOD  Result Value Ref Range Status   Specimen Description BLOOD LEFT ANTECUBITAL  Final   Special Requests   Final    BOTTLES DRAWN AEROBIC AND  ANAEROBIC Blood Culture adequate volume   Culture   Final    NO GROWTH 2 DAYS Performed at [facility], [address] [place], [place] [address]    Report Status PENDING  Incomplete  Blood Culture (routine x 2)     Status: None (Preliminary result)   Collection Time: 05/17/23  9:45 AM   Specimen: BLOOD  Result Value Ref Range Status   Specimen Description BLOOD RIGHT ANTECUBITAL  Final   Special Requests   Final    BOTTLES DRAWN AEROBIC AND ANAEROBIC Blood Culture adequate volume  Culture   Final    NO GROWTH 2 DAYS Performed at [facility], [address] [place], [place] [address]    Report Status PENDING  Incomplete    Radiologic Imaging: No results found.  Assessment & Plan:  77 year old female admitted with sepsis due to right pyelonephritis and gross hematuria.  Urine culture growing low colony counts E faecalis.  No evidence of clot retention and her blood counts are stable.  No plans for urgent urologic intervention at this time.  Recommendations: -Adjust antibiotic therapy for coverage of E faecalis on urine culture, plan for total of 10 to 14 days of culture appropriate therapy -Outpatient cystoscopy with Dr. Lonna Cobb, consider MR urogram per results (iodine allergy) -Consider GYN consult for possible vaginal bleeding  Thank you for involving me in this patient's care, please page with any further questions or concerns.  Carman Ching, PA-C 05/19/2023 5:04 PM

## 2023-05-19 NOTE — TOC CM/SW Note (Signed)
Transition of Care [facility] - Inpatient Brief Assessment   Patient Details  Name: Megan Sheppard MRN: [id number] Date of Birth: [birth year]  [facility]) CM/SW Contact:    Chapman Fitch, RN Phone Number: 05/19/2023, 10:01 AM   Clinical Narrative:   Transition of Care [facility]) Screening Note   Patient Details  Name: Megan Sheppard Date of Birth: [birth year]   Transition of Care [facility]) CM/SW Contact:    Chapman Fitch, RN Phone Number: 05/19/2023, 10:01 AM    Transition of Care Department [facility]) has reviewed patient and no TOC needs have been identified at this time. If new patient transition needs arise, please place a TOC consult.    Transition of Care Asessment: Insurance and Status: Insurance coverage has been reviewed Patient has primary care physician: Yes       Social Determinants of Health Reivew: SDOH reviewed no interventions necessary Readmission risk has been reviewed: Yes Transition of care needs: no transition of care needs at this time

## 2023-05-20 ENCOUNTER — Other Ambulatory Visit (HOSPITAL_COMMUNITY): Payer: Self-pay

## 2023-05-20 ENCOUNTER — Inpatient Hospital Stay: Payer: Medicare Other

## 2023-05-20 DIAGNOSIS — N950 Postmenopausal bleeding: Secondary | ICD-10-CM

## 2023-05-20 DIAGNOSIS — R310 Gross hematuria: Secondary | ICD-10-CM

## 2023-05-20 LAB — GLUCOSE, CAPILLARY
Glucose-Capillary: 158 mg/dL — ABNORMAL HIGH (ref 70–99)
Glucose-Capillary: 141 mg/dL — ABNORMAL HIGH (ref 70–99)

## 2023-05-20 LAB — CBC WITH DIFFERENTIAL/PLATELET
Abs Immature Granulocytes: 0.04 10*3/uL (ref 0.00–0.07)
Basophils Absolute: 0 10*3/uL (ref 0.0–0.1)
Basophils Relative: 1 %
Eosinophils Absolute: 0.2 10*3/uL (ref 0.0–0.5)
Eosinophils Relative: 2 %
HCT: 38.8 % (ref 36.0–46.0)
Hemoglobin: 13 g/dL (ref 12.0–15.0)
Immature Granulocytes: 1 %
Lymphocytes Relative: 26 %
Lymphs Abs: 1.9 10*3/uL (ref 0.7–4.0)
MCH: 28.3 pg (ref 26.0–34.0)
MCHC: 33.5 g/dL (ref 30.0–36.0)
MCV: 84.3 fL (ref 80.0–100.0)
Monocytes Absolute: 0.6 10*3/uL (ref 0.1–1.0)
Monocytes Relative: 8 %
Neutro Abs: 4.6 10*3/uL (ref 1.7–7.7)
Neutrophils Relative %: 62 %
Platelets: 239 10*3/uL (ref 150–400)
RBC: 4.6 MIL/uL (ref 3.87–5.11)
RDW: 14.8 % (ref 11.5–15.5)
WBC: 7.3 10*3/uL (ref 4.0–10.5)
nRBC: 0 % (ref 0.0–0.2)

## 2023-05-20 LAB — BASIC METABOLIC PANEL
Anion gap: 7 (ref 5–15)
BUN: 14 mg/dL (ref 8–23)
CO2: 26 mmol/L (ref 22–32)
Calcium: 8.4 mg/dL — ABNORMAL LOW (ref 8.9–10.3)
Chloride: 107 mmol/L (ref 98–111)
Creatinine, Ser: 0.97 mg/dL (ref 0.44–1.00)
GFR, Estimated: 60 mL/min — ABNORMAL LOW (ref >60)
Glucose, Bld: 136 mg/dL — ABNORMAL HIGH (ref 70–99)
Potassium: 4 mmol/L (ref 3.5–5.1)
Sodium: 140 mmol/L (ref 135–145)

## 2023-05-20 LAB — HEMOGLOBIN FREE, PLASMA: Hgb, Plasma: 16 mg/dL — ABNORMAL HIGH (ref 0.0–4.9)

## 2023-05-20 MED ORDER — LINEZOLID 600 MG PO TABS: 600.0000 mg | ORAL_TABLET | Freq: Two times a day (BID) | ORAL | 0 refills | Status: AC

## 2023-05-20 MED ORDER — LINEZOLID 600 MG PO TABS
600.0000 mg | ORAL_TABLET | Freq: Two times a day (BID) | ORAL | Status: DC
  Administered 2023-05-20: 600 mg via ORAL
  Filled 2023-05-20 (×2): qty 1

## 2023-05-20 NOTE — Discharge Summary (Signed)
Physician Discharge Summary   Patient: Megan Sheppard MRN: [id number] DOB: Mar 23, 1946  Admit date:     05/17/2023  Discharge date: 05/20/23  Discharge Physician: Arnetha Courser   PCP: Leanna Sato, MD   Recommendations at discharge:  Please obtain CBC and BMP on follow-up Please follow-up results of transvaginal ultrasound Follow-up with gynecology Follow-up with urology For low up with primary care provider  Discharge Diagnoses: Principal Problem:   Pyelonephritis Active Problems:   Sepsis (HCC)   DM2 (diabetes mellitus, type 2) (HCC)   Hypothyroidism   HTN (hypertension)   Postmenopausal bleeding   Hematuria, gross   Hospital Course: Megan Sheppard is a 77 y.o. female with medical history significant of hypertension, hypothyroidism, type 2 diabetes, obesity presenting with sepsis, pyelonephritis.  Patient reports acute onset of severe right-sided flank pain hematuria past 24 hours.  Positive nausea.  Noted similar symptoms associated with UTI with  pyelonephritis in remote past.  According to her as she followed up with urologist 3 months ago and was being planned for cystoscopy however she felt better and so she canceled her appointment.   CT abdomen was obtained and it was negative for any obstruction, or nephrolithiasis .  Urine cultures with Enterococcus faecalis.  Patient received ceftriaxone and later discharged on linezolid based on her allergies.  There is questionable hematuria, likely she is having postmenopausal bleeding as she continue to have some bleeding in sanitary pad without urging for urination.  Patient was extremely anxious as her sister recently passed away due to uterine cancer.  Case was discussed with Dr. Thomasene Mohair from gynecology, he advised to obtain pelvic ultrasound and they will see her as outpatient with a very close follow-up appointment.  Pelvic ultrasound was obtained with pending results as it will be followed up by  gynecology.  Hemoglobin stable at 13 on the day of discharge.  Patient continued to have some intermittent bleeding likely vaginal.  Patient is being discharged on current medications, she will continue home medications with addition of linezolid and follow-up with her providers closely for further management.   Consultants: Urology.  Gynecology Procedures performed: None Disposition: Home Diet recommendation:  Discharge Diet Orders (From admission, onward)     Start     Ordered   05/20/23 0000  Diet - low sodium heart healthy        05/20/23 1544           Cardiac and Carb modified diet DISCHARGE MEDICATION: Allergies as of 05/20/2023       Reactions   Iodine Hives   Amoxicillin Swelling   Ciprofloxacin    Other reaction(s): Muscle Pain   Hydrochlorothiazide    Other reaction(s): Dizziness Other reaction(s): Dizziness Other reaction(s): Dizziness Other reaction(s): Dizziness Other reaction(s): Dizziness   Penicillin G    Other reaction(s): Angioedema   Sulfa Antibiotics Other (See Comments)   hematuria   Red Dye #40 (allura Red) Hives, Rash        Medication List     STOP taking these medications    amLODipine 5 MG tablet Commonly known as: NORVASC       TAKE these medications    benazepril 20 MG tablet Commonly known as: LOTENSIN Take 20 mg by mouth daily.   cholecalciferol 25 MCG (1000 UNIT) tablet Commonly known as: VITAMIN D3 Take 2,000 Units by mouth daily.   levothyroxine 50 MCG tablet Commonly known as: SYNTHROID Take 50 mcg by mouth daily.   linezolid 600 MG tablet  Commonly known as: ZYVOX Take 1 tablet (600 mg total) by mouth every 12 (twelve) hours for 20 doses.   vitamin B-12 50 MCG tablet Commonly known as: CYANOCOBALAMIN Take 1 tablet by mouth daily.        Follow-up Information     Leanna Sato, MD. Schedule an appointment as soon as possible for a visit in 1 week(s).   Specialty: Family Medicine Contact  information: [address] [place] [address] [phone]         Conard Novak, MD. Call.   Specialty: Obstetrics and Gynecology Contact information: [address] [place] [address] [phone]                Discharge Exam: Ceasar Mons Weights   05/17/23 0835  Weight: 89.8 kg   General.  Obese lady, in no acute distress. Pulmonary.  Lungs clear bilaterally, normal respiratory effort. CV.  Regular rate and rhythm, no JVD, rub or murmur. Abdomen.  Soft, nontender, nondistended, BS positive. CNS.  Alert and oriented .  No focal neurologic deficit. Extremities.  No edema, no cyanosis, pulses intact and symmetrical. Psychiatry.  Judgment and insight appears normal.   Condition at discharge: stable  The results of significant diagnostics from this hospitalization (including imaging, microbiology, ancillary and laboratory) are listed below for reference.   Imaging Studies: CT Renal Stone Study  Result Date: 05/17/2023 CLINICAL DATA:  Right flank pain and hematuria. EXAM: CT ABDOMEN AND PELVIS WITHOUT CONTRAST TECHNIQUE: Multidetector CT imaging of the abdomen and pelvis was performed following the standard protocol without IV contrast. RADIATION DOSE REDUCTION: This exam was performed according to the departmental dose-optimization program which includes automated exposure control, adjustment of the mA and/or kV according to patient size and/or use of iterative reconstruction technique. COMPARISON:  CT abdomen and pelvis dated 12/30/2021. FINDINGS: Lower chest: No acute abnormality. Hepatobiliary: Benign hepatic cysts are redemonstrated. No imaging follow-up is recommended for this finding. No gallstones, gallbladder wall thickening, or biliary dilatation. Pancreas: Unremarkable. No pancreatic ductal dilatation or surrounding inflammatory changes. Spleen: Normal in size without focal abnormality. Adrenals/Urinary Tract: Adrenal glands are unremarkable.  Kidneys are normal, without renal calculi, focal lesion, or hydronephrosis. Bladder is unremarkable. Stomach/Bowel: There is a small hiatal hernia. Appendix appears normal. There is colonic diverticulosis without evidence of diverticulitis. No evidence of bowel wall thickening, distention, or inflammatory changes. 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 No enlarged abdominal or pelvic lymph nodes. Reproductive: Uterus and bilateral adnexa are unremarkable. Other: No abdominal wall hernia or abnormality. No abdominopelvic ascites. Musculoskeletal: Degenerative changes are seen in the spine. IMPRESSION: 1. No acute findings in the abdomen or pelvis. No evidence of nephrolithiasis or hydronephrosis. Electronically Signed   By: Romona Curls M.D.   On: 05/17/2023 10:06    Microbiology: Results for orders placed or performed during the hospital encounter of 05/17/23  Urine Culture     Status: Abnormal   Collection Time: 05/17/23  8:44 AM   Specimen: Urine, Clean Catch  Result Value Ref Range Status   Specimen Description   Final    URINE, CLEAN CATCH Performed at [facility], [address] [place], [place] [address]    Special Requests   Final    NONE Performed at [facility], [address] [place], [place] [address]    Culture 10,000 COLONIES/mL ENTEROCOCCUS FAECALIS (A)  Final   Report Status 05/19/2023 FINAL  Final   Organism ID, Bacteria ENTEROCOCCUS FAECALIS (A)  Final      Susceptibility   Enterococcus faecalis - MIC*    AMPICILLIN <=2 SENSITIVE Sensitive     NITROFURANTOIN <=16 SENSITIVE Sensitive     VANCOMYCIN 1 SENSITIVE Sensitive     * 10,000 COLONIES/mL ENTEROCOCCUS FAECALIS  Blood Culture (routine x 2)     Status: None (Preliminary result)   Collection Time: 05/17/23  9:43 AM   Specimen: BLOOD  Result Value Ref Range Status   Specimen Description BLOOD LEFT ANTECUBITAL  Final   Special Requests   Final     BOTTLES DRAWN AEROBIC AND ANAEROBIC Blood Culture adequate volume   Culture   Final    NO GROWTH 3 DAYS Performed at [facility], [address] [place], [place] [address]    Report Status PENDING  Incomplete  Blood Culture (routine x 2)     Status: None (Preliminary result)   Collection Time: 05/17/23  9:45 AM   Specimen: BLOOD  Result Value Ref Range Status   Specimen Description BLOOD RIGHT ANTECUBITAL  Final   Special Requests   Final    BOTTLES DRAWN AEROBIC AND ANAEROBIC Blood Culture adequate volume   Culture   Final    NO GROWTH 3 DAYS Performed at [facility], [address] [place], [place] [address]    Report Status PENDING  Incomplete    Labs: CBC: Recent Labs  Lab 05/17/23 0836 05/18/23 0406 05/19/23 0430 05/20/23 0411  WBC 15.9* 8.9 8.1 7.3  NEUTROABS  --   --  4.9 4.6  HGB 15.8* 12.7 15.1* 13.0  HCT 48.5* 38.6 46.5* 38.8  MCV 86.6 85.2 86.0 84.3  PLT 303 204 241 239   Basic Metabolic Panel: Recent Labs  Lab 05/17/23 0836 05/18/23 0406 05/19/23 0430 05/20/23 0411  NA 134* 137 139 140  K 4.2 4.2 4.2 4.0  CL 103 113* 107 107  CO2 23 23 25 26   GLUCOSE 197* 162* 147* 136*  BUN 12 13 13 14   CREATININE 1.12* 0.96 0.91 0.97  CALCIUM 8.3* 8.3* 8.5* 8.4*   Liver Function Tests: Recent Labs  Lab 05/17/23 0836 05/18/23 0406  AST 25 17  ALT 21 19  ALKPHOS 79 66  BILITOT 0.8 <0.2  PROT 6.3* 5.4*  ALBUMIN 3.5 2.8*   CBG: Recent Labs  Lab 05/19/23 1150 05/19/23 1617 05/19/23 2055 05/20/23 0748 05/20/23 1156  GLUCAP 198* 139* 163* 158* 141*    Discharge time spent: greater than 30 minutes.  This record has been created using Conservation officer, historic buildings. Errors have been sought and corrected,but may not always be located. Such creation errors do not reflect on the standard of care.   Signed: Arnetha Courser, MD [facility] 05/20/2023

## 2023-05-20 NOTE — Plan of Care (Signed)

## 2023-05-20 NOTE — Discharge Instructions (Signed)
 While on the antibiotic linezolid avoid or minimize following foods and drinks as they may interact with linezolid.  These foods and drinks contain tyramine.  Tyramine is a natural product found in some plants and animals.  Too much tyramine in combination with linezolid can cause high levels of serotonin in the body.  Serotonin is a chemical in our body that controls mood, sleep, digestion, and other functions.  Several signs of too much serotonin in the body (also called serotonin syndrome) are fast heart rate, sweating, fevers, high blood pressure, muscle twitching, and confusion.  It is important to seek medical attention if you have these symptoms.   Avoid foods and beverages that are high in tyramine while taking linezolid, including: Alcoholic beverages (such as beer, red wine, vermouth, sherry) Aged cheeses (such as cheddar, blue cheese, swiss, feta, parmesan, camembert) Fermented or pickled foods (such as sauerkraut, pickled beets, pickled peppers, pickled cucumbers/pickles, kim chee/kimchi)  Dried/aged, smoked or processed meats and sausages (such as salami, pepperoni, liverwurst, hot dogs, bologna, bacon) Soybean products (such as soy sauce, tofu) Preserved fish (such as pickled herring) Products that contain large amounts of yeast (such as bouillon cubes, powdered soup/gravy, homemade or sourdough bread)  Broad/fava beans  Following foods are OK to eat while taking linezolid: Pasteurized cheeses (such as [place], ricotta, cottage cheese, cream cheese) Vegetables (not fermented or pickled) Non-cured or smoked meats

## 2023-05-20 NOTE — Plan of Care (Signed)

## 2023-05-20 NOTE — Progress Notes (Signed)
Pharmacy - Antibiotic Stewardship  Recommendation per urology is to treat enterococcus pyelonephritis.  The UA noted to have 6-10 epithelial cells with 10k CFU/ml of E. Faecalis (could this be skin contaminant as not clean catch and explain why WBC improved on ceftriaxone which is not active against this).  Patient did not have dysuria prior to admission but main complaints were back pain and hematuria.  She has amoxicillin allergy listed which she stated was mouth swelling in her 20's or 30's.  She went to pharmacy and they told her to stop amoxicillin.  She did stop and resolved without additional medical intervention.  She notes she tolerated penicillin as a child.  She stated ciprofloxacin causes leg weakness (occurred on a re-challenge as well).    Discussed with patient and provider the two options to treat Enterococcus pyelonephritis is Mrs Wick is do an amoxicillin oral challenge and prescribe amoxicillin if tolerates vs.  Linezolid.  Copay is $49 and her [facility] pharmacy confirms has in stock.  She stated can afford this copay.  Juliette Alcide, PharmD, BCPS, BCIDP Work Cell: [phone] 05/20/2023 12:32 PM

## 2023-05-20 NOTE — TOC Progression Note (Signed)
Transition of Care [facility]) - Progression Note    Patient Details  Name: Megan Sheppard MRN: [id number] Date of Birth: [birth year]  [facility] CM/SW Contact  Chapman Fitch, RN Phone Number: 05/20/2023, 9:34 AM  Clinical Narrative:     Please consult TOC if home IV antibiotics indicated at discharge or any other TOC needs arise        Expected Discharge Plan and Services                                               Social Determinants of Health (SDOH) Interventions SDOH Screenings   Food Insecurity: No Food Insecurity (05/17/2023)  Housing: Low Risk  (05/17/2023)  Transportation Needs: No Transportation Needs (05/17/2023)  Utilities: Not At Risk (05/17/2023)  Tobacco Use: Low Risk  (05/17/2023)    Readmission Risk Interventions     No data to display

## 2023-05-20 NOTE — TOC Progression Note (Signed)
Transition of Care [facility]) - Progression Note    Patient Details  Name: Megan Sheppard MRN: [id number] Date of Birth: Sep 25, 1945  Transition of [facility]) CM/SW Contact  Darolyn Rua, [place] Phone Number: 05/20/2023, 3:54 PM  Clinical Narrative:     Patient to discharge today with PO ABX, confirmed with pharmacist. No TOC discharge planning needs identified.        Expected Discharge Plan and Services         Expected Discharge Date: 05/20/23                                     Social Determinants of Health (SDOH) Interventions SDOH Screenings   Food Insecurity: No Food Insecurity (05/17/2023)  Housing: Low Risk  (05/17/2023)  Transportation Needs: No Transportation Needs (05/17/2023)  Utilities: Not At Risk (05/17/2023)  Tobacco Use: Low Risk  (05/17/2023)    Readmission Risk Interventions     No data to display

## 2023-05-20 NOTE — Care Management Important Message (Signed)
Important Message  Patient Details  Name: Megan Sheppard MRN: [id number] Date of Birth: Jun 26, 1946   Important Message Given:  Yes - Medicare IM     [name] A [name] 05/20/2023, 2:24 PM

## 2023-05-22 LAB — CULTURE, BLOOD (ROUTINE X 2)
Culture: NO GROWTH
Culture: NO GROWTH
Special Requests: ADEQUATE
Special Requests: ADEQUATE

## 2023-05-24 ENCOUNTER — Emergency Department
Admission: EM | Admit: 2023-05-24 | Discharge: 2023-05-24 | Disposition: A | Discharge status: Home / Self Care | Payer: Medicare Other | Attending: Emergency Medicine | Admitting: Emergency Medicine

## 2023-05-24 ENCOUNTER — Other Ambulatory Visit: Payer: Self-pay

## 2023-05-24 DIAGNOSIS — E119 Type 2 diabetes mellitus without complications: Secondary | ICD-10-CM | POA: Insufficient documentation

## 2023-05-24 DIAGNOSIS — R1310 Dysphagia, unspecified: Secondary | ICD-10-CM | POA: Diagnosis present

## 2023-05-24 DIAGNOSIS — B370 Candidal stomatitis: Secondary | ICD-10-CM | POA: Diagnosis not present

## 2023-05-24 DIAGNOSIS — I10 Essential (primary) hypertension: Secondary | ICD-10-CM | POA: Diagnosis not present

## 2023-05-24 DIAGNOSIS — E039 Hypothyroidism, unspecified: Secondary | ICD-10-CM | POA: Diagnosis not present

## 2023-05-24 DIAGNOSIS — R070 Pain in throat: Secondary | ICD-10-CM

## 2023-05-24 MED ORDER — FLUCONAZOLE 100 MG PO TABS
100.0000 mg | ORAL_TABLET | Freq: Once | ORAL | Status: AC
  Administered 2023-05-24: 100 mg via ORAL
  Filled 2023-05-24: qty 1

## 2023-05-24 MED ORDER — NYSTATIN 100000 UNIT/ML MT SUSP
5.0000 mL | Freq: Once | OROMUCOSAL | Status: AC
  Administered 2023-05-24: 500000 [IU] via ORAL
  Filled 2023-05-24: qty 5

## 2023-05-24 MED ORDER — NYSTATIN 100000 UNIT/ML MT SUSP: 5.0000 mL | Freq: Four times a day (QID) | OROMUCOSAL | 0 refills | Status: DC

## 2023-05-24 NOTE — Discharge Instructions (Signed)
Use the nystatin as prescribed for the thrush.  As discussed, continue taking the linezolid as prescribed and finish the full course.  You may take Benadryl as needed for itching.  Return to the ER immediately for new, worsening, or persistent severe throat pain, throat or lip swelling, difficulty swallowing, shortness of breath, rash or hives, or any other new or worsening symptoms that concern you.

## 2023-05-24 NOTE — ED Triage Notes (Addendum)
Pt to ed from home via POV for allergic reaction. Pt states "I feel like my throat is scratchy". Pt was DC from here on 11/10 and was prescribed medications. She feel like she is allergic to them. Her symptoms started yesterday. Pt has no rash or hives. Pt airway intact and maintains own secretions at this time. No respiratory distress. Pt did take benadryl before she left home. Pt is caox4, in no acute distress and ambulatory in triage. Pt has no audible wheezing or stridor in triage.

## 2023-05-24 NOTE — ED Provider Notes (Signed)
[facility] Provider Note    Event Date/Time   First MD Initiated Contact with Patient 05/24/23 0259     (approximate)   History   Allergic Reaction   HPI  Megan Sheppard is a 77 y.o. female with history of hypertension, type 2 diabetes, hypothyroidism, and obesity who presents with sore throat, discomfort with swallowing, and mild lip swelling after starting on 2 new medications this week.  The patient was just hospitalized for pyelonephritis and was started on linezolid on Wednesday.  It is now early Sunday morning.  Yesterday she developed the above symptoms.  She denies any associated shortness of breath, difficulty swallowing, tongue swelling, hives, other rash, or itching.  She has had similar adverse reactions to multiple antibiotics in the past.  She was also started on progesterone a few days ago due to vaginal bleeding.  I reviewed the past medical records.  The patient was admitted from 11/10 to 11/13 with pyelonephritis and sepsis.  She was treated in the hospital with ceftriaxone and discharged on linezolid.   Physical Exam   Triage Vital Signs: ED Triage Vitals  Encounter Vitals Group     BP 05/24/23 0224 (!) 150/122     Systolic BP Percentile --      Diastolic BP Percentile --      Pulse Rate 05/24/23 0224 90     Resp 05/24/23 0224 18     Temp 05/24/23 0224 97.8 F (36.6 C)     Temp Source 05/24/23 0224 Oral     SpO2 05/24/23 0224 96 %     Weight --      Height 05/24/23 0237 5' (1.524 m)     Head Circumference --      Peak Flow --      Pain Score 05/24/23 0237 0     Pain Loc --      Pain Education --      Exclude from Growth Chart --     Most recent vital signs: Vitals:   05/24/23 0237 05/24/23 0300  BP: (!) 108/48 (!) 105/59  Pulse:  70  Resp:    Temp:    SpO2:  99%     General: Awake, no distress.  CV:  Good peripheral perfusion.  Resp:  Normal effort.  Lungs CTAB. Abd:  No distention.  Other:  Small amount of  white patchy discharge on tongue.  Oropharynx with slight erythema, no exudates.  No visible oropharyngeal swelling.  No stridor or pooled secretions.  Clear voice.  No visible lip swelling.  No urticaria or other rash   ED Results / Procedures / Treatments   Labs (all labs ordered are listed, but only abnormal results are displayed) Labs Reviewed - No data to display   EKG     RADIOLOGY    PROCEDURES:  Critical Care performed: No  Procedures   MEDICATIONS ORDERED IN ED: Medications  fluconazole (DIFLUCAN) tablet 100 mg (100 mg Oral Given 05/24/23 0410)  nystatin (MYCOSTATIN) 100000 UNIT/ML suspension 500,000 Units (500,000 Units Oral Given 05/24/23 0412)     IMPRESSION / MDM / ASSESSMENT AND PLAN / ED COURSE  I reviewed the triage vital signs and the nursing notes.  77 year old female with PMH as noted above who presents with throat discomfort, painful swallowing, and mild lip swelling after starting on linezolid and progesterone several days ago.  The patient took 3 days of the antibiotic before the symptoms started.  On exam she is well-appearing with  normal vital signs, slight erythema to the oropharynx, mild thrush on the tongue, but no other findings to suggest allergic reaction.  Differential diagnosis includes, but is not limited to, oral thrush, mild pharyngitis, allergic reaction.  At this time there is no indication for lab workup or emergent treatment.  The patient took Benadryl at home with improvement in her symptoms.    I had an extensive discussion with the patient and her family member about the plan going forward.  Given that the patient has allergies and adverse reactions to numerous antibiotics the patient is hesitant to switch medications and risk a potentially worse reaction.  We also discussed simply discontinuing the linezolid, although the patient is also not comfortable with this, given that she does not want an incompletely treated infection that  could recur.    Since the patient is not having evidence of any life-threatening allergic reaction or acute anaphylaxis, the other option would be to continue the linezolid and progesterone, treat the thrush, and monitor the patient's symptoms closely.  This is what the patient would prefer to do.  I have given nystatin here.  She is also susceptible to yeast infections and requested Diflucan.  I will prescribe a course of nystatin and have instructed her to continue the linezolid and follow-up with her doctor.  I gave strict return precautions, the patient and her family member expressed understanding.  Patient's presentation is most consistent with acute, uncomplicated illness.     FINAL CLINICAL IMPRESSION(S) / ED DIAGNOSES   Final diagnoses:  Thrush  Throat discomfort     Rx / DC Orders   ED Discharge Orders          Ordered    nystatin (MYCOSTATIN) 100000 UNIT/ML suspension  4 times daily        05/24/23 0415             Note:  This document was prepared using Dragon voice recognition software and may include unintentional dictation errors.    Dionne Bucy, MD 05/24/23 [phone]

## 2023-06-01 ENCOUNTER — Ambulatory Visit: Payer: Medicare Other | Admitting: Urology

## 2023-06-01 ENCOUNTER — Encounter: Payer: Self-pay | Admitting: Urology

## 2023-06-01 VITALS — BP 145/85 | HR 90 | Ht 60.0 in | Wt 201.0 lb

## 2023-06-01 DIAGNOSIS — N939 Abnormal uterine and vaginal bleeding, unspecified: Secondary | ICD-10-CM | POA: Diagnosis not present

## 2023-06-01 DIAGNOSIS — Z87440 Personal history of urinary (tract) infections: Secondary | ICD-10-CM | POA: Diagnosis not present

## 2023-06-01 DIAGNOSIS — N390 Urinary tract infection, site not specified: Secondary | ICD-10-CM

## 2023-06-01 LAB — URINALYSIS, COMPLETE
Bilirubin, UA: NEGATIVE
Ketones, UA: NEGATIVE
Leukocytes,UA: NEGATIVE
Nitrite, UA: NEGATIVE
RBC, UA: NEGATIVE
Specific Gravity, UA: >1.03 — ABNORMAL HIGH (ref 1.005–1.030)
Urobilinogen, Ur: 1 mg/dL (ref 0.2–1.0)
pH, UA: 5.5 (ref 5.0–7.5)

## 2023-06-01 LAB — MICROSCOPIC EXAMINATION

## 2023-06-01 NOTE — Progress Notes (Signed)
   06/01/23  CC:  Chief Complaint  Patient presents with   Cysto    HPI: Recent hospitalization for UTI.  She was thought to have gross hematuria however on further evaluation was found to be significant vaginal bleeding.  She saw Dr. Feliberto Gottron in gynecology 11/14 and has a follow-up appointment tomorrow.  Refer to [facility] Vaillancourt's consult note 05/19/2023.  Insufficient quantity of voided urine to run urinalysis.  Cath specimen was collected which on microscopy showed no WBC/RBC  Blood pressure (!) 145/85, pulse 90, height 5' (1.524 m), weight 201 lb (91.2 kg). NED. A&Ox3.   No respiratory distress   Abd soft, NT, ND Normal external genitalia with patent urethral meatus  Cystoscopy Procedure Note  Patient identification was confirmed, informed consent was obtained, and patient was prepped using Betadine solution.  Lidocaine jelly was administered per urethral meatus.    Procedure: - Flexible cystoscope introduced, without any difficulty.   - Thorough search of the bladder revealed:    normal urethral meatus    normal urothelium    no stones    no ulcers     no tumors    no urethral polyps    no trabeculation  - Ureteral orifices were normal in position and appearance.  Post-Procedure: - Patient tolerated the procedure well  Assessment/ Plan: No bladder mucosal abnormalities identified UA today clear Urine cytology sent   Riki Altes, MD

## 2023-06-02 ENCOUNTER — Other Ambulatory Visit: Payer: Self-pay | Admitting: Obstetrics and Gynecology

## 2023-06-02 DIAGNOSIS — Z01818 Encounter for other preprocedural examination: Secondary | ICD-10-CM

## 2023-06-02 MED ORDER — POVIDONE-IODINE 10 % EX SWAB: 2.0000 | Freq: Once | CUTANEOUS | Status: DC

## 2023-06-02 MED ORDER — LEVONORGESTREL 20.1 MCG/DAY IU IUD: 1.0000 | INTRAUTERINE_SYSTEM | INTRAUTERINE | Status: DC

## 2023-06-02 MED ORDER — LACTATED RINGERS IV SOLN: INTRAVENOUS | Status: DC

## 2023-06-02 NOTE — Addendum Note (Signed)
Addended by: Mila Merry A on: 06/02/2023 03:34 PM   Modules accepted: Orders

## 2023-06-02 NOTE — Progress Notes (Signed)
GYN H&P Name: Megan Sheppard MRN: [id number] Date of Service: 06/02/2023   CC: f/u postmenopausal bleeding   HPI: Megan Sheppard 77 y.o. [id number] with a hx of obesity, HTN, hypothyroidism, T2DM (A1c 8.3), osteoporosis, Vit B12 deficiency, and CKD who presents for follow-up of postmenopausal bleeding.   Reports doing better. Saw urology yesterday and that went well. Per chart review cysto normal and UA neg for WBC/RBC. Reports no more vaginal bleeding. Started Provera 10 mg BID after last visit and then stopped bleeding with 2-3 days of starting it.   Wants to know why cannot have a hysterectomy.   Not on any blood thinners. Not taking anything for diabetes.   Previous HPI: Patient was in the hospital from 11/10-11/14 for sepsis secondary to pyelonephritis and AKI on CKD. Reviewed records, labs, and imaging. Notes indicate question of hematuria versus postmenopausal bleeding. Hb 15.8 (05/17/23) > 13.0 (05/21/23). Cr 0.97 (05/21/23). UA 05/17/23 amber urine, large Hb, 30 protein, few bacteria, >50 RBC, > 50 WBC and urine culture 10000 E faecalis. Pelvic ultrasound 05/20/23 uterus 7.1x5.6x4.1cm, EMS 13 mm. Hb 13.0 (05/21/23). CT renal 05/17/23 report "no acute findings in abdomen or pelvis". Discharged home yesterday on linezolid for 10 days.  Patient reports discharged yesterday and feeling tired. Reports last week has vaginal spotting and clots when urinated. Urine was yellow but saw red when wiped. In the hospital continued to have vaginal bleeding that was not only when urinating. Changing soaked pads every few hours.   Sister had uterine cancer in her 11s.   PCP: Chinita Pester, MD   ROS: All other systems reviewed and negative   PMHx: Past Medical History:  Diagnosis Date   Arthritis    HTN (hypertension)       PSHx: Past Surgical History:  Procedure Laterality Date   INCISION TENDON SHEATH FOR TRIGGER FINGER     RIGHT ANKLE FRACTURE REPAIR     Hardware in place       OBHx: OB History  Gravida Para Term Preterm AB Living  5 5 4 1   3   SAB IAB Ectopic Molar Multiple Live Births            5    # Outcome Date GA Lbr Len/2nd Weight Sex Type Anes PTL Lv  5 Term      Vag-Spont   LIV  4 Term      Vag-Spont   LIV  3 Term      Vag-Spont   LIV  2 Term      Vag-Spont   ND  1 Preterm      Vag-Spont   ND     GYN Hx: - Menopause: 85s, never been on HRT, denies PMB until last week - Pap hx: Denies any history of abnormals, last years ago. Never had any excisional procedures - STI hx: Denies any history of STIs - Sexual preference: Not sexually active  - Abdominal surgeries: none - GYN procedures: none  FHx: Denies FHx of ovarian, cervical, and colon cancer Sister- uterine cancer 22 Other sister- breast cancer 15s   Meds: Current Outpatient Medications on File Prior to Visit  Medication Sig Dispense Refill   amLODIPine (NORVASC) 5 MG tablet Take 5 mg by mouth once daily.     benazepril (LOTENSIN) 20 MG tablet Take 20 mg by mouth once daily.     cholecalciferol (VITAMIN D3) 1000 unit tablet Take by mouth     cyanocobalamin, vitamin B-12, 2,500 mcg Subl Place  under the tongue     levothyroxine (SYNTHROID, LEVOTHROID) 50 MCG tablet Take 50 mcg by mouth once daily Take on an empty stomach with a glass of water at least 30-60 minutes before breakfast.     Current Facility-Administered Medications on File Prior to Visit  Medication Dose Route Frequency Provider Last Rate Last Admin   BUPivacaine HCl (MARCAINE) 0.5 % injection 3 mL  3 mL Intra-articular  Patience Musca, PA   3 mL at 05/01/23 1031   BUPivacaine HCl (MARCAINE) 0.5 % injection 3 mL  3 mL Intra-articular  Patience Musca, PA   3 mL at 05/01/23 1146   lidocaine (XYLOCAINE) 1 % injection 3 mg  3 mg Intra-articular  Patience Musca, PA   3 mg at 05/01/23 1031   lidocaine (XYLOCAINE) 1 % injection 3 mg  3 mg Intra-articular  Patience Musca, PA   3 mg at  05/01/23 1146   triamcinolone acetonide (KENALOG-40) injection 40 mg  40 mg Intra-articular  Patience Musca, PA   40 mg at 05/01/23 1031   triamcinolone acetonide (KENALOG-40) injection 40 mg  40 mg Intra-articular  Patience Musca, PA   40 mg at 05/01/23 1146     Allergies: Allergies  Allergen Reactions   Amoxicillin Swelling   Iodine Hives   Penicillin Angioedema   Ciprofloxacin Muscle Pain    States causes unbearable muscle pains    Hydrochlorothiazide Dizziness    Other reaction(s): Dizziness Other reaction(s): Dizziness    Sulfa (Sulfonamide Antibiotics) Other (See Comments) and Unknown    hematuria hematuria   Red Dye Hives and Rash     SocHx: Social History   Tobacco Use   Smoking status: Never    Passive exposure: Never   Smokeless tobacco: Never  Vaping Use   Vaping status: Never Used  Substance Use Topics   Alcohol use: Never   Drug use: Never     OBJECTIVE: BP 117/67   Pulse 82   Ht 152.4 cm (5')   Wt 91.9 kg (202 lb 9.6 oz)   BMI 39.57 kg/m    Gen: NAD HEENT: [place]/AT Heart: Regular rate Lungs: Normal work of breathing Abdomen: soft, nontender, nondistended Ext: No BLE edema   TRANSABDOMINAL AND TRANSVAGINAL ULTRASOUND OF PELVIS- 05/20/23   TECHNIQUE:  Both transabdominal and transvaginal ultrasound examinations of the  pelvis were performed. Transabdominal technique was performed for  global imaging of the pelvis including uterus, ovaries, adnexal  regions, and pelvic cul-de-sac. It was necessary to proceed with  endovaginal exam following the transabdominal exam to visualize the  endometrium.   COMPARISON: May 17, 2023.   FINDINGS:  Uterus   Measurements: 7.1 x 5.6 x 4.1 cm = volume: 84 mL. 1 cm fibroid is noted anteriorly.   Endometrium   Thickness: 13 mm which is abnormally thickened for postmenopausal patient. No focal abnormality visualized.   Right ovary   Not visualized.   Left ovary   Not  visualized.   Other findings   No abnormal free fluid.   IMPRESSION:  Endometrium is abnormally thickened at 13 mm for postmenopausal patient. In the setting of post-menopausal bleeding, endometrial sampling is indicated to exclude carcinoma. If results are benign, sonohysterogram should be considered for focal lesion work-up. (Ref:  Radiological Reasoning: Algorithmic Workup of Abnormal Vaginal Bleeding with Endovaginal Sonography and Sonohysterography. AJR 2008; 161:W96-04).    Electronically Signed    By: Lupita Raider M.D.    On: 05/20/2023 19:06  CT ABDOMEN AND PELVIS WITHOUT CONTRAST   TECHNIQUE:  Multidetector CT imaging of the abdomen and pelvis was performed  following the standard protocol without IV contrast.   RADIATION DOSE REDUCTION: This exam was performed according to the  departmental dose-optimization program which includes automated  exposure control, adjustment of the mA and/or kV according to  patient size and/or use of iterative reconstruction technique.   COMPARISON:  CT abdomen and pelvis dated 12/30/2021.   FINDINGS:  Lower chest: No acute abnormality.   Hepatobiliary: Benign hepatic cysts are redemonstrated. No imaging  follow-up is recommended for this finding. No gallstones,  gallbladder wall thickening, or biliary dilatation.   Pancreas: Unremarkable. No pancreatic ductal dilatation or  surrounding inflammatory changes.   Spleen: Normal in size without focal abnormality.   Adrenals/Urinary Tract: Adrenal glands are unremarkable. Kidneys are  normal, without renal calculi, focal lesion, or hydronephrosis.  Bladder is unremarkable.   Stomach/Bowel: There is a small hiatal hernia. Appendix appears  normal. There is colonic diverticulosis without evidence of  diverticulitis. No evidence of bowel wall thickening, distention, or  inflammatory changes.   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 No enlarged abdominal or pelvic lymph nodes.   Reproductive: Uterus and bilateral adnexa are unremarkable.   Other: No abdominal wall hernia or abnormality. No abdominopelvic  ascites.   Musculoskeletal: Degenerative changes are seen in the spine.   IMPRESSION:  1. No acute findings in the abdomen or pelvis. No evidence of  nephrolithiasis or hydronephrosis.     ASSESSMENT/PLAN: Megan Sheppard 77 y.o. [id number] with a hx of obesity, HTN, hypothyroidism, T2DM (A1c 8.3), osteoporosis, Vit B12 deficiency, and CKD who presents for follow-up of postmenopausal bleeding. This will serve as pre-op visit for hysteroscopy, D&C, Myosure polypectomy, Liletta IUD insertion, colposcopy, and ECC.    #Postmenopausal bleeding - Exam at last visit notable for globular nodular cervix although no lesions visualized. Blood in the vault with slow ooze.  - HB 13.4 (05/21/23) - Pelvic ultrasound with thickened EMS 13 mm - Pap ASCUS with neg HRHPV. Will plan for colposcopy, biopsies, and ECC in the OR. - EMB 05/21/23 8 passes of blood. Pathology showed benign endometrial polyp in background of secretory endometrium with breakdown, no hyperplasia or malignancy.  - Today discussed recommendation for a hysteroscopy, D&C, Myosure polypectomy, and Liletta IUD insertion given severity of postmenopausal bleeding and thickened endometrial stripe. Do not recommend proceeding to hysterectomy given this is a larger surgery (which may not be necessary) and we do not have a definitive diagnosis.  - Discussed recommendation for placement of Liletta IUD at time of procedure given risk factors for endometrial cancer. If no hyperplasia is found, the progesterone will serve as prevention. If EIN is found, it could be curative.  - Discussed risks/ benefits/ indication. Discussed risks of bleeding, infection, and injury to surrounding structures (bladder, ureters, bowel, uterine perforation). Discussed in the case of heavy  bleeding small change of needing laparoscopic surgery. Surgical and blood consents signed. - Case request submitted  - Continue Provera 10 mg BID - Patient needs pre-op medical clearance from PCP- scheduled to see next week - Ucx ordered given recent admission for sepsis pyelonephritis - Pre-op labs: CBC and CMP ordered - Pre-op orders placed (no antibiotics indicated)  #T2DM (A1c 8.3) - Not currently on any medications - Given elevated A1c, recommended f/u with PCP to start medication to better control glucose to minimize peri-op risks. Goal for glucose <180 and ideally <  140 peri-operatively.   #CKD - 05/20/23 Cr 0.97 (GFR 60) > CMP ordered  #Hx recent pyelonephritis - Ucx ordered to assess for treatment of UTI    Arlo Butt Roda Shutters, MD

## 2023-06-08 ENCOUNTER — Telehealth: Payer: Self-pay | Admitting: Urology

## 2023-06-08 NOTE — Telephone Encounter (Signed)
Urine cytology showed no abnormal cells 

## 2023-06-09 NOTE — Telephone Encounter (Signed)
Notified patient as instructed, patient pleased °

## 2023-06-10 ENCOUNTER — Other Ambulatory Visit: Payer: BLUE CROSS/BLUE SHIELD

## 2023-06-12 ENCOUNTER — Encounter
Admission: RE | Admit: 2023-06-12 | Discharge: 2023-06-12 | Disposition: A | Discharge status: Home / Self Care | Payer: Medicare Other | Source: Ambulatory Visit | Attending: Obstetrics and Gynecology | Admitting: Obstetrics and Gynecology

## 2023-06-12 DIAGNOSIS — Z01810 Encounter for preprocedural cardiovascular examination: Secondary | ICD-10-CM

## 2023-06-12 DIAGNOSIS — I10 Essential (primary) hypertension: Secondary | ICD-10-CM

## 2023-06-12 DIAGNOSIS — Z01818 Encounter for other preprocedural examination: Secondary | ICD-10-CM

## 2023-06-12 DIAGNOSIS — E1169 Type 2 diabetes mellitus with other specified complication: Secondary | ICD-10-CM

## 2023-06-12 HISTORY — DX: Type 2 diabetes mellitus without complications: E11.9

## 2023-06-12 HISTORY — DX: Personal history of other diseases of the digestive system: Z87.19

## 2023-06-12 HISTORY — DX: Anemia, unspecified: D64.9

## 2023-06-12 HISTORY — DX: Atrial premature depolarization: I49.1

## 2023-06-12 HISTORY — DX: Personal history of other specified conditions: Z87.898

## 2023-06-12 HISTORY — DX: Gastro-esophageal reflux disease without esophagitis: K21.9

## 2023-06-12 HISTORY — DX: Urinary tract infection, site not specified: N39.0

## 2023-06-12 HISTORY — DX: Postmenopausal bleeding: N95.0

## 2023-06-12 NOTE — Patient Instructions (Addendum)
Your procedure is scheduled on:06-18-23 Thursday Report to the Registration Desk on the 1st floor of the [facility].Then proceed to the 2nd floor Surgery Desk To find out your arrival time, please call [phone] between 1PM - 3PM on:06-17-23 Wednesday If your arrival time is 6:00 am, do not arrive before that time as the [facility] entrance doors do not open until 6:00 am.  REMEMBER: Instructions that are not followed completely may result in serious medical risk, up to and including death; or upon the discretion of your surgeon and anesthesiologist your surgery may need to be rescheduled.  Do not eat food after midnight the night before surgery.  No gum chewing or hard candies.  You may however, drink Water up to 2 hours before you are scheduled to arrive for your surgery. Do not drink anything within 2 hours of your scheduled arrival time.  Your Surgeon has ordered you to Drink Gatorade (G2) the morning of surgery-Have the Gatorade finished 2 hours prior to your arrival time to the hospital  One week prior to surgery:Stop NOW (06-12-23) Stop Anti-inflammatories (NSAIDS) such as Advil, Aleve, Ibuprofen, Motrin, Naproxen, Naprosyn and Aspirin based products such as Excedrin, Goody's Powder, BC Powder. Stop ANY OVER THE COUNTER supplements until after surgery (Vitamin D3, B12)  You may however, continue to take Tylenol if needed for pain up until the day of surgery.  Stop metFORMIN (GLUCOPHAGE) 2 days prior to surgery-Last dose will be on 06-15-23 Monday  Continue taking all of your other prescription medications up until the day of surgery.  ON THE DAY OF SURGERY ONLY TAKE THESE MEDICATIONS WITH SIPS OF WATER: -amLODipine (NORVASC)  -levothyroxine (SYNTHROID)   No Alcohol for 24 hours before or after surgery.  No Smoking including e-cigarettes for 24 hours before surgery.  No chewable tobacco products for at least 6 hours before surgery.  No nicotine patches on the day of  surgery.  Do not use any "recreational" drugs for at least a week (preferably 2 weeks) before your surgery.  Please be advised that the combination of cocaine and anesthesia may have negative outcomes, up to and including death. If you test positive for cocaine, your surgery will be cancelled.  On the morning of surgery brush your teeth with toothpaste and water, you may rinse your mouth with mouthwash if you wish. Do not swallow any toothpaste or mouthwash.  Do not wear jewelry, make-up, hairpins, clips or nail polish.  For welded (permanent) jewelry: bracelets, anklets, waist bands, etc.  Please have this removed prior to surgery.  If it is not removed, there is a chance that hospital personnel will need to cut it off on the day of surgery.  Do not wear lotions, powders, or perfumes.   Do not shave body hair from the neck down 48 hours before surgery.  Contact lenses, hearing aids and dentures may not be worn into surgery.  Do not bring valuables to the hospital. [facility] is not responsible for any missing/lost belongings or valuables.   Notify your doctor if there is any change in your medical condition (cold, fever, infection).  Wear comfortable clothing (specific to your surgery type) to the hospital.  After surgery, you can help prevent lung complications by doing breathing exercises.  Take deep breaths and cough every 1-2 hours. Your doctor may order a device called an Incentive Spirometer to help you take deep breaths. When coughing or sneezing, hold a pillow firmly against your incision with both hands. This is  called "splinting." Doing this helps protect your incision. It also decreases belly discomfort.  If you are being admitted to the hospital overnight, leave your suitcase in the car. After surgery it may be brought to your room.  In case of increased patient census, it may be necessary for you, the patient, to continue your postoperative care in the Same Day Surgery  department.  If you are being discharged the day of surgery, you will not be allowed to drive home. You will need a responsible individual to drive you home and stay with you for 24 hours after surgery.   If you are taking public transportation, you will need to have a responsible individual with you.  Please call the Pre-admissions Testing Dept. at [phone] if you have any questions about these instructions.  Surgery Visitation Policy:  Patients having surgery or a procedure may have two visitors.  Children under the age of 32 must have an adult with them who is not the patient.

## 2023-06-12 NOTE — H&P (Signed)
History and Physical Interval Note:  06/18/2023 8:11 AM  Megan Sheppard  has presented today for surgery, with the diagnosis of postmenopausal bleeding, abnormal pap smear.  The various methods of treatment have been discussed with the patient and family. After consideration of risks, benefits and other options for treatment, the patient has consented to  Procedure(s): DILATATION & CURETTAGE/HYSTEROSCOPY WITH MYOSURE (N/A) COLPOSCOPY WITH ENDOCERVICAL CURRETTAGE (N/A) INTRAUTERINE DEVICE (IUD) INSERTION MIRENA (N/A) as a surgical intervention. The patient's history has been reviewed, patient examined, no change in status, stable for surgery.  I have reviewed the patient's chart and labs.  Questions were answered to the patient's satisfaction.     Amarii Bordas V Clay Solum  GYN H&P Name: Megan Sheppard MRN: [id number] Date of Service: 06/12/2023   CC: pre-op   HPI: Megan Sheppard 77 y.o. [id number] with a hx of obesity, HTN, hypothyroidism, T2DM (A1c 8.3), osteoporosis, Vit B12 deficiency, and CKD who presents for follow-up of postmenopausal bleeding. Scheduled for hysteroscopy, D&C, polypectomy, colposcopy, ECC, Mirena IUD placement on 06/18/23.   Previous HPI: Patient was in the hospital from 11/10-11/14 for sepsis secondary to pyelonephritis and AKI on CKD. Reviewed records, labs, and imaging. Notes indicate question of hematuria versus postmenopausal bleeding. Hb 15.8 (05/17/23) > 13.0 (05/21/23). Cr 0.97 (05/21/23). UA 05/17/23 amber urine, large Hb, 30 protein, few bacteria, >50 RBC, > 50 WBC and urine culture 10000 E faecalis. Pelvic ultrasound 05/20/23 uterus 7.1x5.6x4.1cm, EMS 13 mm. Hb 13.0 (05/21/23). CT renal 05/17/23 report "no acute findings in abdomen or pelvis". Discharged home yesterday on linezolid for 10 days.  Patient reports discharged yesterday and feeling tired. Reports last week has vaginal spotting and clots when urinated. Urine was yellow but saw red when wiped. In the  hospital continued to have vaginal bleeding that was not only when urinating. Changing soaked pads every few hours.   Sister had uterine cancer in her 58s.   PCP: Megan Pester, MD   ROS: All other systems reviewed and negative   PMHx: Past Medical History:  Diagnosis Date   Arthritis    HTN (hypertension)       PSHx: Past Surgical History:  Procedure Laterality Date   INCISION TENDON SHEATH FOR TRIGGER FINGER     RIGHT ANKLE FRACTURE REPAIR     Hardware in place      OBHx: OB History  Gravida Para Term Preterm AB Living  5 5 4 1   3   SAB IAB Ectopic Molar Multiple Live Births            5    # Outcome Date GA Lbr Len/2nd Weight Sex Type Anes PTL Lv  5 Term      Vag-Spont   LIV  4 Term      Vag-Spont   LIV  3 Term      Vag-Spont   LIV  2 Term      Vag-Spont   ND  1 Preterm      Vag-Spont   ND     GYN Hx: - Menopause: 25s, never been on HRT, no PMB until Nov 2024 - Pap hx: only abnormal was last, last 05/21/23 ASCUS with neg HRHPV. Never had any excisional procedures - STI hx: Denies any history of STIs - Sexual preference: Not sexually active  - Abdominal surgeries: none - GYN procedures: none  FHx: Denies FHx of ovarian, cervical, and colon cancer Sister- uterine cancer 80 Other sister- breast cancer 69s   Meds: Current Outpatient  Medications on File Prior to Visit  Medication Sig Dispense Refill   amLODIPine (NORVASC) 5 MG tablet Take 5 mg by mouth once daily.     benazepril (LOTENSIN) 20 MG tablet Take 20 mg by mouth once daily.     cholecalciferol (VITAMIN D3) 1000 unit tablet Take by mouth     cyanocobalamin, vitamin B-12, 2,500 mcg Subl Place under the tongue     levothyroxine (SYNTHROID, LEVOTHROID) 50 MCG tablet Take 50 mcg by mouth once daily Take on an empty stomach with a glass of water at least 30-60 minutes before breakfast.     Current Facility-Administered Medications on File Prior to Visit  Medication Dose Route Frequency Provider  Last Rate Last Admin   BUPivacaine HCl (MARCAINE) 0.5 % injection 3 mL  3 mL Intra-articular  Megan Musca, PA   3 mL at 05/01/23 1031   BUPivacaine HCl (MARCAINE) 0.5 % injection 3 mL  3 mL Intra-articular  Megan Musca, PA   3 mL at 05/01/23 1146   lidocaine (XYLOCAINE) 1 % injection 3 mg  3 mg Intra-articular  Megan Musca, PA   3 mg at 05/01/23 1031   lidocaine (XYLOCAINE) 1 % injection 3 mg  3 mg Intra-articular  Megan Musca, PA   3 mg at 05/01/23 1146   triamcinolone acetonide (KENALOG-40) injection 40 mg  40 mg Intra-articular  Megan Musca, PA   40 mg at 05/01/23 1031   triamcinolone acetonide (KENALOG-40) injection 40 mg  40 mg Intra-articular  Megan Musca, PA   40 mg at 05/01/23 1146     Allergies: Allergies  Allergen Reactions   Amoxicillin Swelling   Iodine Hives   Penicillin Angioedema   Ciprofloxacin Muscle Pain    States causes unbearable muscle pains    Hydrochlorothiazide Dizziness    Other reaction(s): Dizziness Other reaction(s): Dizziness    Sulfa (Sulfonamide Antibiotics) Other (See Comments) and Unknown    hematuria hematuria   Red Dye Hives and Rash     SocHx: Social History   Tobacco Use   Smoking status: Never    Passive exposure: Never   Smokeless tobacco: Never  Vaping Use   Vaping status: Never Used  Substance Use Topics   Alcohol use: Never   Drug use: Never     OBJECTIVE: BP 117/67   Pulse 82   Ht 152.4 cm (5')   Wt 91.9 kg (202 lb 9.6 oz)   BMI 39.57 kg/m    Gen: NAD HEENT: [place]/AT Heart: Regular rate Lungs: Normal work of breathing Abdomen: soft, nontender, nondistended Ext: No BLE edema   TRANSABDOMINAL AND TRANSVAGINAL ULTRASOUND OF PELVIS- 05/20/23   TECHNIQUE:  Both transabdominal and transvaginal ultrasound examinations of the  pelvis were performed. Transabdominal technique was performed for  global imaging of the pelvis including  uterus, ovaries, adnexal  regions, and pelvic cul-de-sac. It was necessary to proceed with  endovaginal exam following the transabdominal exam to visualize the  endometrium.   COMPARISON: May 17, 2023.   FINDINGS:  Uterus   Measurements: 7.1 x 5.6 x 4.1 cm = volume: 84 mL. 1 cm fibroid is noted anteriorly.   Endometrium   Thickness: 13 mm which is abnormally thickened for postmenopausal patient. No focal abnormality visualized.   Right ovary   Not visualized.   Left ovary   Not visualized.   Other findings   No abnormal free fluid.   IMPRESSION:  Endometrium is abnormally thickened at 13 mm for  postmenopausal patient. In the setting of post-menopausal bleeding, endometrial sampling is indicated to exclude carcinoma. If results are benign, sonohysterogram should be considered for focal lesion work-up. (Ref:  Radiological Reasoning: Algorithmic Workup of Abnormal Vaginal Bleeding with Endovaginal Sonography and Sonohysterography. AJR 2008; 308:M57-84).    Electronically Signed    By: Megan Sheppard M.D.    On: 05/20/2023 19:06    CT ABDOMEN AND PELVIS WITHOUT CONTRAST   TECHNIQUE:  Multidetector CT imaging of the abdomen and pelvis was performed  following the standard protocol without IV contrast.   RADIATION DOSE REDUCTION: This exam was performed according to the  departmental dose-optimization program which includes automated  exposure control, adjustment of the mA and/or kV according to  patient size and/or use of iterative reconstruction technique.   COMPARISON:  CT abdomen and pelvis dated 12/30/2021.   FINDINGS:  Lower chest: No acute abnormality.   Hepatobiliary: Benign hepatic cysts are redemonstrated. No imaging  follow-up is recommended for this finding. No gallstones,  gallbladder wall thickening, or biliary dilatation.   Pancreas: Unremarkable. No pancreatic ductal dilatation or  surrounding inflammatory changes.   Spleen: Normal in size  without focal abnormality.   Adrenals/Urinary Tract: Adrenal glands are unremarkable. Kidneys are  normal, without renal calculi, focal lesion, or hydronephrosis.  Bladder is unremarkable.   Stomach/Bowel: There is a small hiatal hernia. Appendix appears  normal. There is colonic diverticulosis without evidence of  diverticulitis. No evidence of bowel wall thickening, distention, or  inflammatory changes.   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 No enlarged abdominal or pelvic lymph nodes.   Reproductive: Uterus and bilateral adnexa are unremarkable.   Other: No abdominal wall hernia or abnormality. No abdominopelvic  ascites.   Musculoskeletal: Degenerative changes are seen in the spine.   IMPRESSION:  1. No acute findings in the abdomen or pelvis. No evidence of  nephrolithiasis or hydronephrosis.     ASSESSMENT/PLAN: Megan Sheppard 77 y.o. [id number] with a hx of obesity, HTN, hypothyroidism, T2DM (A1c 8.3), osteoporosis, Vit B12 deficiency, and CKD who presents for follow-up of postmenopausal bleeding. Scheduled for hysteroscopy, D&C, polypectomy, colposcopy, ECC, Mirena IUD placement on 06/18/23.    #Postmenopausal bleeding - Exam at last visit notable for globular nodular cervix although no lesions visualized. Blood in the vault with slow ooze.  - HB 13.4 (05/21/23) - Pelvic ultrasound with thickened EMS 13 mm - Pap ASCUS with neg HRHPV. Will plan for colposcopy, biopsies, and ECC in the OR. - EMB 05/21/23 8 passes of blood. Pathology showed benign endometrial polyp in background of secretory endometrium with breakdown, no hyperplasia or malignancy.  - Scheduled for hysteroscopy, D&C, Myosure polypectomy, and Mirena IUD insertion given severity of postmenopausal bleeding and thickened endometrial stripe.  - At pre-op visit discussed risks/ benefits/ indication. Discussed risks of bleeding, infection, and injury to surrounding  structures (bladder, ureters, bowel, uterine perforation). Discussed in the case of heavy bleeding small change of needing laparoscopic surgery. Surgical and blood consents signed. - Continue Provera 10 mg BID - Per patient, received medical clearance from PCP. Will obtain note.  - Pre-op orders placed (no antibiotics indicated)  #T2DM (A1c 8.3) - Not currently on any medications - Goal for glucose <180 and ideally <140 peri-operatively.   #CKD - 05/20/23 Cr 0.97 (GFR 60) > Cr 0.9 (06/02/23)  #Hx recent pyelonephritis - Ucx mixed urogenital flora on 06/02/23    Nuri Larmer Roda Shutters, MD

## 2023-06-15 ENCOUNTER — Encounter
Admission: RE | Admit: 2023-06-15 | Discharge: 2023-06-15 | Disposition: A | Discharge status: Home / Self Care | Payer: Medicare Other | Source: Ambulatory Visit | Attending: Obstetrics and Gynecology | Admitting: Obstetrics and Gynecology

## 2023-06-15 DIAGNOSIS — I10 Essential (primary) hypertension: Secondary | ICD-10-CM | POA: Insufficient documentation

## 2023-06-15 DIAGNOSIS — Z01810 Encounter for preprocedural cardiovascular examination: Secondary | ICD-10-CM | POA: Diagnosis not present

## 2023-06-15 DIAGNOSIS — Z01818 Encounter for other preprocedural examination: Secondary | ICD-10-CM | POA: Insufficient documentation

## 2023-06-15 DIAGNOSIS — E1169 Type 2 diabetes mellitus with other specified complication: Secondary | ICD-10-CM | POA: Diagnosis not present

## 2023-06-15 LAB — CBC
HCT: 39.6 % (ref 36.0–46.0)
Hemoglobin: 12.7 g/dL (ref 12.0–15.0)
MCH: 27.2 pg (ref 26.0–34.0)
MCHC: 32.1 g/dL (ref 30.0–36.0)
MCV: 84.8 fL (ref 80.0–100.0)
Platelets: 490 10*3/uL — ABNORMAL HIGH (ref 150–400)
RBC: 4.67 MIL/uL (ref 3.87–5.11)
RDW: 15.4 % (ref 11.5–15.5)
WBC: 10.3 10*3/uL (ref 4.0–10.5)
nRBC: 0 % (ref 0.0–0.2)

## 2023-06-15 LAB — TYPE AND SCREEN
ABO/RH(D): A POS
Antibody Screen: NEGATIVE

## 2023-06-18 ENCOUNTER — Encounter: Payer: Self-pay | Admitting: Obstetrics and Gynecology

## 2023-06-18 ENCOUNTER — Ambulatory Visit: Payer: Self-pay | Admitting: Urgent Care

## 2023-06-18 ENCOUNTER — Encounter
Admission: RE | Disposition: A | Discharge status: Home / Self Care | Payer: Self-pay | Source: Home / Self Care | Attending: Obstetrics and Gynecology

## 2023-06-18 ENCOUNTER — Other Ambulatory Visit: Payer: Self-pay

## 2023-06-18 ENCOUNTER — Ambulatory Visit
Admission: RE | Admit: 2023-06-18 | Discharge: 2023-06-18 | Disposition: A | Discharge status: Home / Self Care | Payer: Medicare Other | Attending: Obstetrics and Gynecology | Admitting: Obstetrics and Gynecology

## 2023-06-18 DIAGNOSIS — E669 Obesity, unspecified: Secondary | ICD-10-CM | POA: Diagnosis not present

## 2023-06-18 DIAGNOSIS — E039 Hypothyroidism, unspecified: Secondary | ICD-10-CM | POA: Diagnosis not present

## 2023-06-18 DIAGNOSIS — N840 Polyp of corpus uteri: Secondary | ICD-10-CM | POA: Insufficient documentation

## 2023-06-18 DIAGNOSIS — N950 Postmenopausal bleeding: Secondary | ICD-10-CM | POA: Diagnosis present

## 2023-06-18 DIAGNOSIS — I129 Hypertensive chronic kidney disease with stage 1 through stage 4 chronic kidney disease, or unspecified chronic kidney disease: Secondary | ICD-10-CM | POA: Insufficient documentation

## 2023-06-18 DIAGNOSIS — Z6839 Body mass index (BMI) 39.0-39.9, adult: Secondary | ICD-10-CM | POA: Diagnosis not present

## 2023-06-18 DIAGNOSIS — N189 Chronic kidney disease, unspecified: Secondary | ICD-10-CM | POA: Insufficient documentation

## 2023-06-18 DIAGNOSIS — Z30430 Encounter for insertion of intrauterine contraceptive device: Secondary | ICD-10-CM | POA: Diagnosis not present

## 2023-06-18 DIAGNOSIS — E1122 Type 2 diabetes mellitus with diabetic chronic kidney disease: Secondary | ICD-10-CM | POA: Insufficient documentation

## 2023-06-18 DIAGNOSIS — Z01818 Encounter for other preprocedural examination: Secondary | ICD-10-CM

## 2023-06-18 HISTORY — PX: INTRAUTERINE DEVICE (IUD) INSERTION: SHX5877

## 2023-06-18 HISTORY — PX: COLPOSCOPY: SHX161

## 2023-06-18 HISTORY — PX: DILATATION & CURETTAGE/HYSTEROSCOPY WITH MYOSURE: SHX6511

## 2023-06-18 LAB — GLUCOSE, CAPILLARY
Glucose-Capillary: 138 mg/dL — ABNORMAL HIGH (ref 70–99)
Glucose-Capillary: 127 mg/dL — ABNORMAL HIGH (ref 70–99)

## 2023-06-18 LAB — ABO/RH: ABO/RH(D): A POS

## 2023-06-18 SURGERY — DILATATION & CURETTAGE/HYSTEROSCOPY WITH MYOSURE
Anesthesia: General | Site: Vagina

## 2023-06-18 MED ORDER — ACETAMINOPHEN 10 MG/ML IV SOLN
INTRAVENOUS | Status: DC | PRN
  Administered 2023-06-18: 1000 mg via INTRAVENOUS

## 2023-06-18 MED ORDER — LIDOCAINE HCL (CARDIAC) PF 100 MG/5ML IV SOSY
PREFILLED_SYRINGE | INTRAVENOUS | Status: DC | PRN
  Administered 2023-06-18: 80 mg via INTRAVENOUS

## 2023-06-18 MED ORDER — FENTANYL CITRATE (PF) 100 MCG/2ML IJ SOLN
INTRAMUSCULAR | Status: DC | PRN
  Administered 2023-06-18 (×2): 50 ug via INTRAVENOUS

## 2023-06-18 MED ORDER — LEVONORGESTREL 20 MCG/DAY IU IUD
INTRAUTERINE_SYSTEM | INTRAUTERINE | Status: AC
  Filled 2023-06-18: qty 1

## 2023-06-18 MED ORDER — SODIUM CHLORIDE 0.9 % IV SOLN: INTRAVENOUS | Status: DC

## 2023-06-18 MED ORDER — PROPOFOL 10 MG/ML IV BOLUS
INTRAVENOUS | Status: AC
  Filled 2023-06-18: qty 20

## 2023-06-18 MED ORDER — IODINE STRONG (LUGOLS) 5 % PO SOLN
ORAL | Status: AC
  Filled 2023-06-18: qty 1

## 2023-06-18 MED ORDER — LACTATED RINGERS IV SOLN
INTRAVENOUS | Status: DC
Start: 2023-06-18 — End: 2023-06-18

## 2023-06-18 MED ORDER — FENTANYL CITRATE (PF) 100 MCG/2ML IJ SOLN
INTRAMUSCULAR | Status: AC
  Filled 2023-06-18: qty 2

## 2023-06-18 MED ORDER — MONSELS FERRIC SUBSULFATE EX SOLN
CUTANEOUS | Status: AC
  Filled 2023-06-18: qty 8

## 2023-06-18 MED ORDER — PHENYLEPHRINE 80 MCG/ML (10ML) SYRINGE FOR IV PUSH (FOR BLOOD PRESSURE SUPPORT)
PREFILLED_SYRINGE | INTRAVENOUS | Status: DC | PRN
  Administered 2023-06-18 (×2): 80 ug via INTRAVENOUS

## 2023-06-18 MED ORDER — SODIUM CHLORIDE 0.9 % IR SOLN
Status: DC | PRN
Start: 2023-06-18 — End: 2023-06-18
  Administered 2023-06-18: 3000 mL

## 2023-06-18 MED ORDER — ONDANSETRON HCL 4 MG PO TABS: 4.0000 mg | ORAL_TABLET | Freq: Three times a day (TID) | ORAL | 1 refills | Status: AC | PRN

## 2023-06-18 MED ORDER — FENTANYL CITRATE (PF) 100 MCG/2ML IJ SOLN: 25.0000 ug | INTRAMUSCULAR | Status: DC | PRN

## 2023-06-18 MED ORDER — CHLORHEXIDINE GLUCONATE 0.12 % MT SOLN
15.0000 mL | Freq: Once | OROMUCOSAL | Status: AC
Start: 2023-06-18 — End: 2023-06-18
  Administered 2023-06-18: 15 mL via OROMUCOSAL

## 2023-06-18 MED ORDER — ONDANSETRON HCL 4 MG/2ML IJ SOLN
INTRAMUSCULAR | Status: DC | PRN
  Administered 2023-06-18: 4 mg via INTRAVENOUS

## 2023-06-18 MED ORDER — OXYCODONE HCL 5 MG PO TABS: 5.0000 mg | ORAL_TABLET | Freq: Once | ORAL | Status: DC | PRN

## 2023-06-18 MED ORDER — SUCCINYLCHOLINE CHLORIDE 200 MG/10ML IV SOSY
PREFILLED_SYRINGE | INTRAVENOUS | Status: DC | PRN
  Administered 2023-06-18: 100 mg via INTRAVENOUS

## 2023-06-18 MED ORDER — LIDOCAINE-EPINEPHRINE 1 %-1:100000 IJ SOLN
INTRAMUSCULAR | Status: AC
  Filled 2023-06-18: qty 1

## 2023-06-18 MED ORDER — POVIDONE-IODINE 10 % EX SWAB: 2.0000 | Freq: Once | CUTANEOUS | Status: DC

## 2023-06-18 MED ORDER — ACETIC ACID 3 % SOLN
Status: DC | PRN
Start: 2023-06-18 — End: 2023-06-18
  Administered 2023-06-18: 1 via TOPICAL

## 2023-06-18 MED ORDER — LIDOCAINE-EPINEPHRINE 1 %-1:100000 IJ SOLN
INTRAMUSCULAR | Status: DC | PRN
  Administered 2023-06-18: 20 mL

## 2023-06-18 MED ORDER — ORAL CARE MOUTH RINSE: 15.0000 mL | Freq: Once | OROMUCOSAL | Status: AC

## 2023-06-18 MED ORDER — DEXAMETHASONE SODIUM PHOSPHATE 10 MG/ML IJ SOLN
INTRAMUSCULAR | Status: DC | PRN
  Administered 2023-06-18: 8 mg via INTRAVENOUS

## 2023-06-18 MED ORDER — CHLORHEXIDINE GLUCONATE 0.12 % MT SOLN
OROMUCOSAL | Status: AC
  Filled 2023-06-18: qty 15

## 2023-06-18 MED ORDER — OXYCODONE HCL 5 MG/5ML PO SOLN: 5.0000 mg | Freq: Once | ORAL | Status: DC | PRN

## 2023-06-18 MED ORDER — ACETIC ACID 3 % SOLN
Status: AC
  Filled 2023-06-18: qty 500

## 2023-06-18 MED ORDER — PROPOFOL 10 MG/ML IV BOLUS
INTRAVENOUS | Status: DC | PRN
  Administered 2023-06-18: 30 mg via INTRAVENOUS
  Administered 2023-06-18: 150 mg via INTRAVENOUS

## 2023-06-18 MED ORDER — LEVONORGESTREL 20 MCG/DAY IU IUD
1.0000 | INTRAUTERINE_SYSTEM | Freq: Once | INTRAUTERINE | Status: AC
  Administered 2023-06-18: 1 via INTRAUTERINE
  Filled 2023-06-18: qty 1

## 2023-06-18 SURGICAL SUPPLY — 29 items
APPLICATOR COTTON TIP 6 STRL (MISCELLANEOUS) ×1 IMPLANT
APPLICATOR COTTON TIP 6IN STRL (MISCELLANEOUS) ×1
APPLICATOR SWAB PROCTO LG 16IN (MISCELLANEOUS) ×1 IMPLANT
CATH ROBINSON RED A/P 16FR (CATHETERS) ×1 IMPLANT
CNTNR URN SCR LID CUP LEK RST (MISCELLANEOUS) ×1 IMPLANT
DEPRESSOR TONGUE 6 IN STERILE (GAUZE/BANDAGES/DRESSINGS) IMPLANT
DEVICE MYOSURE LITE (MISCELLANEOUS) IMPLANT
DRAPE PERI LITHO V/GYN (MISCELLANEOUS) ×1 IMPLANT
DRSG TELFA 3X8 NADH STRL (GAUZE/BANDAGES/DRESSINGS) ×1 IMPLANT
ELECT REM PT RETURN 9FT ADLT (ELECTROSURGICAL) ×1
ELECTRODE REM PT RTRN 9FT ADLT (ELECTROSURGICAL) ×1 IMPLANT
GAUZE 4X4 16PLY ~~LOC~~+RFID DBL (SPONGE) ×1 IMPLANT
GLOVE SURG SYN 6.5 ES PF (GLOVE) ×1 IMPLANT
GLOVE SURG SYN 6.5 PF PI (GLOVE) ×1 IMPLANT
GOWN STRL REUS W/ TWL LRG LVL3 (GOWN DISPOSABLE) ×2 IMPLANT
IV NS IRRIG 3000ML ARTHROMATIC (IV SOLUTION) ×1 IMPLANT
KIT PROCEDURE FLUENT (KITS) IMPLANT
KIT TURNOVER CYSTO (KITS) ×1 IMPLANT
NDL SPNL 22GX3.5 QUINCKE BK (NEEDLE) ×1 IMPLANT
NEEDLE SPNL 22GX3.5 QUINCKE BK (NEEDLE) ×1 IMPLANT
PACK BASIN MINOR ARMC (MISCELLANEOUS) ×1 IMPLANT
PACK DNC HYST (MISCELLANEOUS) ×1 IMPLANT
PAD PREP OB/GYN DISP 24X41 (PERSONAL CARE ITEMS) ×1 IMPLANT
SCRUB CHG 4% DYNA-HEX 4OZ (MISCELLANEOUS) ×1 IMPLANT
SEAL ROD LENS SCOPE MYOSURE (ABLATOR) ×1 IMPLANT
SYR CONTROL 10ML LL (SYRINGE) ×1 IMPLANT
SYS MIRENA INTRAUTERINE (MISCELLANEOUS) ×1
SYSTEM MIRENA INTRAUTERINE (MISCELLANEOUS) IMPLANT
TOWEL OR 17X26 4PK STRL BLUE (TOWEL DISPOSABLE) ×1 IMPLANT

## 2023-06-18 NOTE — Anesthesia Preprocedure Evaluation (Signed)
Anesthesia Evaluation  Patient identified by MRN, date of birth, ID band Patient awake    Reviewed: Allergy & Precautions, NPO status , Patient's Chart, lab work & pertinent test results  Airway Mallampati: IV  TM Distance: >3 FB Neck ROM: full    Dental  (+) Dental Advidsory Given   Pulmonary neg pulmonary ROS   Pulmonary exam normal        Cardiovascular hypertension, negative cardio ROS Normal cardiovascular exam     Neuro/Psych negative neurological ROS  negative psych ROS   GI/Hepatic Neg liver ROS,GERD  Medicated and Controlled,,  Endo/Other  diabetesHypothyroidism    Renal/GU Renal disease     Musculoskeletal   Abdominal   Peds  Hematology negative hematology ROS (+)   Anesthesia Other Findings Past Medical History: No date: Anemia     Comment:  h/o while having periods No date: CKD (chronic kidney disease), stage II No date: DM (diabetes mellitus), type 2 (HCC) No date: GERD (gastroesophageal reflux disease)     Comment:  no meds No date: Hematuria No date: History of gross hematuria No date: History of hiatal hernia No date: Hypertension No date: Hypothyroidism No date: Myalgia No date: Osteoporosis No date: PAC (premature atrial contraction) No date: PMB (postmenopausal bleeding) 05/2023: Pyelonephritis 05/2023: Sepsis (HCC) No date: Thyroid disease No date: UTI (urinary tract infection) No date: Vitamin B12 deficiency No date: Vitamin D deficiency  Past Surgical History: 2000: ANKLE SURGERY; Right  BMI    Body Mass Index: 39.57 kg/m      Reproductive/Obstetrics negative OB ROS                             Anesthesia Physical Anesthesia Plan  ASA: 3  Anesthesia Plan: General ETT and General   Post-op Pain Management:    Induction: Intravenous  PONV Risk Score and Plan: 3 and Ondansetron and Dexamethasone  Airway Management Planned: Oral  ETT  Additional Equipment:   Intra-op Plan:   Post-operative Plan: Extubation in OR  Informed Consent: I have reviewed the patients History and Physical, chart, labs and discussed the procedure including the risks, benefits and alternatives for the proposed anesthesia with the patient or authorized representative who has indicated his/her understanding and acceptance.     Dental Advisory Given  Plan Discussed with: Anesthesiologist, CRNA and Surgeon  Anesthesia Plan Comments: (Patient consented for risks of anesthesia including but not limited to:  - adverse reactions to medications - damage to eyes, teeth, lips or other oral mucosa - nerve damage due to positioning  - sore throat or hoarseness - Damage to heart, brain, nerves, lungs, other parts of body or loss of life  Patient voiced understanding and assent.)       Anesthesia Quick Evaluation

## 2023-06-18 NOTE — Anesthesia Procedure Notes (Signed)
Procedure Name: Intubation Date/Time: 06/18/2023 10:24 AM  Performed by: Jaye Beagle, CRNAPre-anesthesia Checklist: Patient identified, Emergency Drugs available, Suction available and Patient being monitored Patient Re-evaluated:Patient Re-evaluated prior to induction Oxygen Delivery Method: Circle system utilized Preoxygenation: Pre-oxygenation with 100% oxygen Induction Type: IV induction Ventilation: Mask ventilation without difficulty Laryngoscope Size: McGrath and 3 Grade View: Grade I Tube type: Oral Tube size: 7.0 mm Number of attempts: 1 Airway Equipment and Method: Stylet and Oral airway Placement Confirmation: ETT inserted through vocal cords under direct vision, positive ETCO2 and breath sounds checked- equal and bilateral Secured at: 19 cm Tube secured with: Tape Dental Injury: Teeth and Oropharynx as per pre-operative assessment

## 2023-06-18 NOTE — Transfer of Care (Signed)
Immediate Anesthesia Transfer of Care Note  Patient: Megan Sheppard  Procedure(s) Performed: DILATATION & CURETTAGE/HYSTEROSCOPY WITH MYOSURE (Vagina ) COLPOSCOPY WITH ENDOCERVICAL CURRETTAGE (Vagina ) INTRAUTERINE DEVICE (IUD) INSERTION MIRENA (Vagina )  Patient Location: PACU  Anesthesia Type:General  Level of Consciousness: drowsy  Airway & Oxygen Therapy: Patient Spontanous Breathing and Patient connected to face mask oxygen  Post-op Assessment: Report given to RN  Post vital signs: stable  Last Vitals:  Vitals Value Taken Time  BP 116/55 06/18/23 1145  Temp    Pulse 67 06/18/23 1145  Resp 12 06/18/23 1145  SpO2 100 % 06/18/23 1145  Vitals shown include unfiled device data.  Last Pain:  Vitals:   06/18/23 0751  TempSrc: Tympanic  PainSc: 0-No pain         Complications: No notable events documented.

## 2023-06-18 NOTE — Anesthesia Postprocedure Evaluation (Signed)
Anesthesia Post Note  Patient: Megan Sheppard  Procedure(s) Performed: DILATATION & CURETTAGE/HYSTEROSCOPY WITH MYOSURE POLYPECTOMY (Vagina ) COLPOSCOPY WITH ENDOCERVICAL CURRETTAGE (Vagina ) INTRAUTERINE DEVICE (IUD) INSERTION MIRENA (Vagina )  Patient location during evaluation: PACU Anesthesia Type: General Level of consciousness: awake and alert Pain management: pain level controlled Vital Signs Assessment: post-procedure vital signs reviewed and stable Respiratory status: spontaneous breathing, nonlabored ventilation, respiratory function stable and patient connected to nasal cannula oxygen Cardiovascular status: blood pressure returned to baseline and stable Postop Assessment: no apparent nausea or vomiting Anesthetic complications: no  No notable events documented.   Last Vitals:  Vitals:   06/18/23 1145 06/18/23 1201  BP: (!) 116/55 111/62  Pulse: 67 72  Resp: 12 15  Temp: (!) 36.4 C   SpO2: 100% 98%    Last Pain:  Vitals:   06/18/23 1201  TempSrc:   PainSc: 0-No pain                 Stephanie Coup

## 2023-06-18 NOTE — Op Note (Signed)
Operative Note   Patient name: NORMA ZAKARIAN MRN: [id number] Surgery Date: 06/18/23  Preoperative diagnosis: Postmenopausal bleeding  Postoperative diagnosis: Postmenopausal bleeding Proliferative endometrium Endometrial polyps   Procedure: Hysteroscopy, Dilation & Curettage, Myosure polypectomy, colposcopy, cervical biopsies, endocervical curettage, and Mirena IUD insertion Surgeon: Kathalene Frames, MD Anesthesia: General   Fluids: EBL: 10cc  IVF: 300 cc UOP: 30cc  Fluid deficit: 315cc   Complications: none  Specimens: endometrial curettings, endocervical curettings, cervical biopsies (3, 6, 9, and 12 o'clock) Findings: Stage 3 rectocele. Anteverted uterus that was sounded to 7cm. Multiparous cervix without lesions. No acetowhite change, abnormal vasculature, or mosaicism noted during colposcopy. Proliferative endocervical canal. Proliferative irregular endometrium with multiple small polyps. Bilateral ostia visualized; right ostia scarred over. Polyps removed with Mysoure. Good amount of endometrial tissue obtained with Myosure and with sharp curettage.    Indications: Postmenopausal bleeding, thickened endometrial stripe, polypoid tissue on biopsy   Procedure:  The risks, benefits, indications, and alternative of the procedure were reviewed with the patient in pre-operative area and informed consent was obtained. The patient was taken to the operating room with IV in place. General anesthesia was administered. She was placed in the dorsal lithotomy position, prepped and draped in the usual sterile fashion. A red rubber catheter was used to drain the bladder. A sterile speculum was placed in the patient's vagina and the cervix visualized. A single tooth tenaculum was placed on the anterior lip of the cervix. Paracervical block performed by injecting a total of 20cc of 1% lidocaine with epinephrine into the vaginal fornices at 4 and 8 o'clock (care was taken to pull back prior to  injection). The uterus was then gently sounded to 7 cm. The cervix was dilated to allow for hysteroscope using Pratt dilators. The hysteroscopy was done with normal saline with the above findings were noted. The Myosure was used to remove the polyps and resect the proliferative endometrium. The hysteroscopy was then removed. A sharp curettage was then performed until a gritty texture was noted. Endocervical curettage was performed. The IUD was inserted to the fundus, deployed, and the applicator was removed.  The strings were cut 3 cm from the external cervical os. Tenaculum was removed and hemostasis was noted. The cervix was swabbed with acetic acid and inspected using the colposcope. No abnormalities visualized; green field filter was used. Cervical biopsies taken from each quadrant. Monsel's was applied. Hemostasis noted.  Specimens were sent to pathology. Vaginal instruments were removed. Hemostasis noted. The patient tolerated the procedure well and was taken to the recovery room in stable condition.   Signed by: Romana Juniper, Md, 06/18/23, 12:00 PM

## 2023-06-19 ENCOUNTER — Encounter: Payer: Self-pay | Admitting: Obstetrics and Gynecology

## 2023-06-22 LAB — SURGICAL PATHOLOGY

## 2023-08-13 ENCOUNTER — Other Ambulatory Visit: Payer: Self-pay | Admitting: Orthopedic Surgery

## 2023-08-13 DIAGNOSIS — M75101 Unspecified rotator cuff tear or rupture of right shoulder, not specified as traumatic: Secondary | ICD-10-CM

## 2023-08-13 DIAGNOSIS — R29898 Other symptoms and signs involving the musculoskeletal system: Secondary | ICD-10-CM

## 2023-08-17 ENCOUNTER — Ambulatory Visit
Admission: RE | Admit: 2023-08-17 | Discharge: 2023-08-17 | Disposition: A | Discharge status: Home / Self Care | Payer: Medicare Other | Source: Ambulatory Visit | Attending: Orthopedic Surgery | Admitting: Orthopedic Surgery

## 2023-08-17 DIAGNOSIS — R29898 Other symptoms and signs involving the musculoskeletal system: Secondary | ICD-10-CM | POA: Insufficient documentation

## 2023-08-17 DIAGNOSIS — M75101 Unspecified rotator cuff tear or rupture of right shoulder, not specified as traumatic: Secondary | ICD-10-CM | POA: Diagnosis present

## 2023-08-17 DIAGNOSIS — M75102 Unspecified rotator cuff tear or rupture of left shoulder, not specified as traumatic: Secondary | ICD-10-CM | POA: Insufficient documentation

## 2023-09-17 ENCOUNTER — Other Ambulatory Visit: Payer: Self-pay | Admitting: Orthopedic Surgery

## 2023-09-22 ENCOUNTER — Encounter: Payer: Self-pay | Admitting: Orthopedic Surgery

## 2023-09-22 NOTE — Anesthesia Preprocedure Evaluation (Addendum)
 Anesthesia Evaluation  Patient identified by MRN, date of birth, ID band Patient awake    Reviewed: Allergy & Precautions, H&P , NPO status , Patient's Chart, lab work & pertinent test results  Airway Mallampati: III  TM Distance: <3 FB Neck ROM: Full  Mouth opening: Limited Mouth Opening  Dental no notable dental hx.    Pulmonary neg pulmonary ROS   Pulmonary exam normal breath sounds clear to auscultation       Cardiovascular hypertension, Normal cardiovascular exam Rhythm:Regular Rate:Normal     Neuro/Psych negative neurological ROS  negative psych ROS   GI/Hepatic negative GI ROS, Neg liver ROS, hiatal hernia,GERD  ,,  Endo/Other  diabetesHypothyroidism    Renal/GU Renal diseasenegative Renal ROS  negative genitourinary   Musculoskeletal negative musculoskeletal ROS (+)    Abdominal   Peds negative pediatric ROS (+)  Hematology negative hematology ROS (+) Blood dyscrasia, anemia   Anesthesia Other Findings Allergen Reactions  Amoxicillin Swelling  Iodine Hives  Penicillin Angioedema  Ciprofloxacin Muscle Pain  States causes unbearable muscle pains   09-06-21 Timeline:  - 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 and WNL per patient   Past Medical History: Past Medical History:  Diagnosis Date   Disease of thyroid gland   Hypertension   Previous images: IMPRESSION: No specific cause of right abducens nerve palsy is identified on this non-contrast examination.  5 mm round T2 hyperintense focus with mild surrounding T2 FLAIR hyperintense signal abnormality within the anterior left temporal lobe. This may reflect a benign cyst with mild surrounding gliosis. However, alternative etiologies (such as a cystic neoplasm) cannot be excluded. Post-contrast MR  imaging of the brain is recommended for further characterization. Whether or not there is corresponding abnormal enhancement at this site, additional follow-up imaging is recommended to document stability.  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     Thyroid disease Vitamin D deficiency Myalgia Vitamin B12 deficiency CKD (chronic kidney disease), stage II Osteoporosis Hypertension Hypothyroidism Hematuria PAC (premature atrial contraction) UTI (urinary tract infection) DM (diabetes mellitus), type 2 (HCC) Pyelonephritis History of gross hematuria PMB (postmenopausal bleeding) Sepsis (HCC) GERD (gastroesophageal reflux disease) History of hiatal hernia Anemia       Reproductive/Obstetrics negative OB ROS                             Anesthesia Physical Anesthesia Plan  ASA: 3  Anesthesia Plan: General ETT   Post-op Pain Management:    Induction: Intravenous, Rapid sequence and Cricoid pressure planned  PONV Risk Score and Plan:   Airway Management Planned: Oral ETT  Additional Equipment:   Intra-op Plan:   Post-operative Plan: Extubation in OR  Informed Consent: I have reviewed the patients History and Physical, chart, labs and discussed the procedure including the risks, benefits and alternatives for the proposed anesthesia with the patient or authorized representative who has indicated his/her understanding and acceptance.     Dental Advisory Given  Plan Discussed with: Anesthesiologist, CRNA and Surgeon  Anesthesia Plan Comments: (Patient consented for risks of anesthesia including but not limited to:  - adverse reactions to medications - damage to eyes, teeth, lips or other oral mucosa - nerve damage due to positioning  -  sore throat or hoarseness - Damage to heart, brain, nerves, lungs, other parts of body or loss of life  Patient voiced  understanding and assent.)       Anesthesia Quick Evaluation

## 2023-10-01 ENCOUNTER — Other Ambulatory Visit: Payer: Self-pay

## 2023-10-01 ENCOUNTER — Encounter: Payer: Self-pay | Admitting: Orthopedic Surgery

## 2023-10-01 ENCOUNTER — Ambulatory Visit
Admission: RE | Admit: 2023-10-01 | Discharge: 2023-10-01 | Disposition: A | Discharge status: Home / Self Care | Attending: Orthopedic Surgery | Admitting: Orthopedic Surgery

## 2023-10-01 ENCOUNTER — Encounter
Admission: RE | Disposition: A | Discharge status: Home / Self Care | Payer: Self-pay | Source: Home / Self Care | Attending: Orthopedic Surgery

## 2023-10-01 ENCOUNTER — Ambulatory Visit: Payer: Self-pay | Admitting: Anesthesiology

## 2023-10-01 DIAGNOSIS — E039 Hypothyroidism, unspecified: Secondary | ICD-10-CM | POA: Insufficient documentation

## 2023-10-01 DIAGNOSIS — M25811 Other specified joint disorders, right shoulder: Secondary | ICD-10-CM | POA: Diagnosis present

## 2023-10-01 DIAGNOSIS — M19011 Primary osteoarthritis, right shoulder: Secondary | ICD-10-CM | POA: Insufficient documentation

## 2023-10-01 DIAGNOSIS — I10 Essential (primary) hypertension: Secondary | ICD-10-CM | POA: Diagnosis not present

## 2023-10-01 DIAGNOSIS — E119 Type 2 diabetes mellitus without complications: Secondary | ICD-10-CM | POA: Diagnosis not present

## 2023-10-01 DIAGNOSIS — K449 Diaphragmatic hernia without obstruction or gangrene: Secondary | ICD-10-CM | POA: Diagnosis not present

## 2023-10-01 DIAGNOSIS — Z7984 Long term (current) use of oral hypoglycemic drugs: Secondary | ICD-10-CM | POA: Insufficient documentation

## 2023-10-01 DIAGNOSIS — M75101 Unspecified rotator cuff tear or rupture of right shoulder, not specified as traumatic: Secondary | ICD-10-CM | POA: Diagnosis not present

## 2023-10-01 HISTORY — PX: SHOULDER ARTHROSCOPY WITH OPEN ROTATOR CUFF REPAIR: SHX6092

## 2023-10-01 LAB — GLUCOSE, CAPILLARY: Glucose-Capillary: 130 mg/dL — ABNORMAL HIGH (ref 70–99)

## 2023-10-01 SURGERY — ARTHROSCOPY, SHOULDER WITH REPAIR, ROTATOR CUFF, OPEN
Anesthesia: General | Site: Shoulder | Laterality: Right

## 2023-10-01 MED ORDER — PHENYLEPHRINE 80 MCG/ML (10ML) SYRINGE FOR IV PUSH (FOR BLOOD PRESSURE SUPPORT)
PREFILLED_SYRINGE | INTRAVENOUS | Status: AC
  Filled 2023-10-01: qty 10

## 2023-10-01 MED ORDER — LACTATED RINGERS IR SOLN
Status: DC | PRN
  Administered 2023-10-01: 9000 mL
  Administered 2023-10-01 (×3): 6000 mL
  Administered 2023-10-01 (×2): 3000 mL

## 2023-10-01 MED ORDER — FENTANYL CITRATE (PF) 100 MCG/2ML IJ SOLN
INTRAMUSCULAR | Status: AC
  Filled 2023-10-01: qty 2

## 2023-10-01 MED ORDER — OXYCODONE HCL 5 MG PO TABS: 10.0000 mg | ORAL_TABLET | Freq: Once | ORAL | Status: DC

## 2023-10-01 MED ORDER — MIDAZOLAM HCL 5 MG/5ML IJ SOLN
INTRAMUSCULAR | Status: DC | PRN
  Administered 2023-10-01 (×2): 1 mg via INTRAVENOUS

## 2023-10-01 MED ORDER — OXYCODONE HCL 5 MG PO TABS
ORAL_TABLET | ORAL | Status: AC
  Filled 2023-10-01: qty 2

## 2023-10-01 MED ORDER — EPHEDRINE 5 MG/ML INJ
INTRAVENOUS | Status: AC
  Filled 2023-10-01: qty 5

## 2023-10-01 MED ORDER — OXYCODONE HCL 5 MG PO TABS: 5.0000 mg | ORAL_TABLET | ORAL | 0 refills | Status: AC | PRN

## 2023-10-01 MED ORDER — EPHEDRINE SULFATE (PRESSORS) 50 MG/ML IJ SOLN
INTRAMUSCULAR | Status: DC | PRN
  Administered 2023-10-01 (×3): 5 mg via INTRAVENOUS

## 2023-10-01 MED ORDER — DEXAMETHASONE SODIUM PHOSPHATE 10 MG/ML IJ SOLN
INTRAMUSCULAR | Status: DC | PRN
  Administered 2023-10-01: 4 mg
  Administered 2023-10-01: 10 mg

## 2023-10-01 MED ORDER — SUGAMMADEX SODIUM 200 MG/2ML IV SOLN
INTRAVENOUS | Status: AC
  Filled 2023-10-01: qty 4

## 2023-10-01 MED ORDER — CEFAZOLIN SODIUM-DEXTROSE 2-3 GM-%(50ML) IV SOLR
INTRAVENOUS | Status: AC
  Filled 2023-10-01: qty 50

## 2023-10-01 MED ORDER — FENTANYL CITRATE (PF) 100 MCG/2ML IJ SOLN: 100.0000 ug | Freq: Once | INTRAMUSCULAR | Status: DC

## 2023-10-01 MED ORDER — ROCURONIUM BROMIDE 100 MG/10ML IV SOLN
INTRAVENOUS | Status: DC | PRN
  Administered 2023-10-01: 50 mg via INTRAVENOUS

## 2023-10-01 MED ORDER — ACETAMINOPHEN 500 MG PO TABS: 1000.0000 mg | ORAL_TABLET | Freq: Three times a day (TID) | ORAL | 2 refills | Status: AC

## 2023-10-01 MED ORDER — MIDAZOLAM HCL 2 MG/2ML IJ SOLN
INTRAMUSCULAR | Status: AC
  Filled 2023-10-01: qty 2

## 2023-10-01 MED ORDER — BUPIVACAINE HCL 0.25 % IJ SOLN
INTRAMUSCULAR | Status: AC
  Filled 2023-10-01: qty 1

## 2023-10-01 MED ORDER — FENTANYL CITRATE (PF) 100 MCG/2ML IJ SOLN
INTRAMUSCULAR | Status: DC | PRN
  Administered 2023-10-01: 100 ug via INTRAVENOUS

## 2023-10-01 MED ORDER — DEXAMETHASONE SODIUM PHOSPHATE 10 MG/ML IJ SOLN
INTRAMUSCULAR | Status: AC
Start: 2023-10-01 — End: ?
  Filled 2023-10-01: qty 1

## 2023-10-01 MED ORDER — LACTATED RINGERS IV SOLN
INTRAVENOUS | Status: DC | PRN
  Administered 2023-10-01: 4 mL

## 2023-10-01 MED ORDER — ONDANSETRON HCL 4 MG/2ML IJ SOLN
INTRAMUSCULAR | Status: DC | PRN
  Administered 2023-10-01: 4 mg via INTRAVENOUS

## 2023-10-01 MED ORDER — LIDOCAINE HCL (PF) 2 % IJ SOLN
INTRAMUSCULAR | Status: AC
  Filled 2023-10-01: qty 5

## 2023-10-01 MED ORDER — BUPIVACAINE HCL (PF) 0.25 % IJ SOLN
INTRAMUSCULAR | Status: DC | PRN
  Administered 2023-10-01: 20 mL via PERINEURAL

## 2023-10-01 MED ORDER — ONDANSETRON 4 MG PO TBDP: 4.0000 mg | ORAL_TABLET | Freq: Three times a day (TID) | ORAL | 0 refills | Status: AC | PRN

## 2023-10-01 MED ORDER — SUGAMMADEX SODIUM 200 MG/2ML IV SOLN
INTRAVENOUS | Status: DC | PRN
  Administered 2023-10-01: 300 mg via INTRAVENOUS

## 2023-10-01 MED ORDER — ONDANSETRON HCL 4 MG/2ML IJ SOLN
INTRAMUSCULAR | Status: AC
  Filled 2023-10-01: qty 2

## 2023-10-01 MED ORDER — LACTATED RINGERS IV SOLN: INTRAVENOUS | Status: DC

## 2023-10-01 MED ORDER — PROPOFOL 10 MG/ML IV BOLUS
INTRAVENOUS | Status: DC | PRN
  Administered 2023-10-01: 200 mg via INTRAVENOUS

## 2023-10-01 MED ORDER — BUPIVACAINE LIPOSOME 1.3 % IJ SUSP
INTRAMUSCULAR | Status: AC
  Filled 2023-10-01: qty 10

## 2023-10-01 MED ORDER — CEFAZOLIN SODIUM-DEXTROSE 1-4 GM/50ML-% IV SOLN
INTRAVENOUS | Status: DC | PRN
  Administered 2023-10-01: 2 g via INTRAVENOUS

## 2023-10-01 MED ORDER — LIDOCAINE HCL (CARDIAC) PF 100 MG/5ML IV SOSY
PREFILLED_SYRINGE | INTRAVENOUS | Status: DC | PRN
  Administered 2023-10-01: 100 mg via INTRAVENOUS

## 2023-10-01 MED ORDER — SODIUM CHLORIDE 0.9 % IV SOLN: INTRAVENOUS | Status: DC | PRN

## 2023-10-01 MED ORDER — PHENYLEPHRINE HCL (PRESSORS) 10 MG/ML IV SOLN
INTRAVENOUS | Status: DC | PRN
  Administered 2023-10-01 (×7): 80 ug via INTRAVENOUS

## 2023-10-01 MED ORDER — ASPIRIN 325 MG PO TBEC
325.0000 mg | DELAYED_RELEASE_TABLET | Freq: Every day | ORAL | 0 refills | Status: AC
Start: 2023-10-01 — End: 2023-10-15

## 2023-10-01 MED ORDER — SEVOFLURANE IN SOLN
RESPIRATORY_TRACT | Status: AC
  Filled 2023-10-01: qty 250

## 2023-10-01 MED ORDER — DEXAMETHASONE SODIUM PHOSPHATE 4 MG/ML IJ SOLN
INTRAMUSCULAR | Status: AC
  Filled 2023-10-01: qty 2

## 2023-10-01 MED ORDER — CLINDAMYCIN PHOSPHATE 300 MG/2ML IJ SOLN
INTRAMUSCULAR | Status: AC
  Filled 2023-10-01: qty 6

## 2023-10-01 MED ORDER — MIDAZOLAM HCL 2 MG/2ML IJ SOLN
2.0000 mg | Freq: Once | INTRAMUSCULAR | Status: AC
  Administered 2023-10-01: 2 mg via INTRAVENOUS

## 2023-10-01 MED ORDER — BUPIVACAINE LIPOSOME 1.3 % IJ SUSP
INTRAMUSCULAR | Status: DC | PRN
  Administered 2023-10-01: 10 mL via PERINEURAL

## 2023-10-01 SURGICAL SUPPLY — 46 items
ANCHOR 2.3 SP SGL 1.2 XBRAID (Anchor) IMPLANT
ANCHOR 3.9 PEEK CORKSCREW 5MTS (Anchor) IMPLANT
ANCHOR SUT BIO SW 4.75X19.1 (Anchor) IMPLANT
ANCHOR SUT BIOCOMP LK 2.9X12.5 (Anchor) IMPLANT
ANCHOR SWIVELOCK BIO 4.75X19.1 (Anchor) IMPLANT
ANCHOR SWIVELOCK BIO COMP (Anchor) IMPLANT
BLADE SHAVER 4.5X7 STR FR (MISCELLANEOUS) ×1 IMPLANT
BUR BR 5.5 WIDE MOUTH (BURR) ×1 IMPLANT
CANNULA PART THRD DISP 5.75X7 (CANNULA) ×1 IMPLANT
CANNULA PARTIAL THREAD 2X7 (CANNULA) ×1 IMPLANT
CANNULA TWIST IN 8.25X7CM (CANNULA) IMPLANT
CHLORAPREP W/TINT 26 (MISCELLANEOUS) ×1 IMPLANT
COOLER POLAR GLACIER W/PUMP (MISCELLANEOUS) ×1 IMPLANT
COVER LIGHT HANDLE UNIVERSAL (MISCELLANEOUS) ×2 IMPLANT
DRAPE STERI 35X30 U-POUCH (DRAPES) IMPLANT
DRSG TEGADERM 4X4.75 (GAUZE/BANDAGES/DRESSINGS) ×3 IMPLANT
ELECT REM PT RETURN 9FT ADLT (ELECTROSURGICAL) ×1 IMPLANT
ELECTRODE REM PT RTRN 9FT ADLT (ELECTROSURGICAL) ×1 IMPLANT
GAUZE SPONGE 4X4 12PLY STRL (GAUZE/BANDAGES/DRESSINGS) ×1 IMPLANT
GAUZE XEROFORM 1X8 LF (GAUZE/BANDAGES/DRESSINGS) ×1 IMPLANT
GLOVE BIOGEL PI IND STRL 8 (GLOVE) IMPLANT
GLOVE SURG SS PI 7.5 STRL IVOR (GLOVE) IMPLANT
GLOVE SURG SYN 8.0 (GLOVE) ×1 IMPLANT
GLOVE SURG SYN 8.0 PF PI (GLOVE) IMPLANT
GOWN STRL REIN 2XL XLG LVL4 (GOWN DISPOSABLE) ×1 IMPLANT
GOWN STRL REUS W/ TWL LRG LVL3 (GOWN DISPOSABLE) ×3 IMPLANT
IV LR IRRIG 3000ML ARTHROMATIC (IV SOLUTION) ×6 IMPLANT
KIT CORKSCREW KNTLS 3.9 S/T/P (INSTRUMENTS) IMPLANT
KIT STABILIZATION SHOULDER (MISCELLANEOUS) ×1 IMPLANT
KIT TURNOVER KIT A (KITS) ×1 IMPLANT
MANIFOLD NEPTUNE II (INSTRUMENTS) IMPLANT
MASK FACE SPIDER DISP (MASK) ×1 IMPLANT
MAT ABSORB FLUID 56X50 GRAY (MISCELLANEOUS) ×2 IMPLANT
PACK ARTHROSCOPY SHOULDER (MISCELLANEOUS) ×1 IMPLANT
PAD ABD DERMACEA PRESS 5X9 (GAUZE/BANDAGES/DRESSINGS) ×2 IMPLANT
PAD WRAPON POLAR SHDR XLG (MISCELLANEOUS) ×1 IMPLANT
PASSER SUT FIRSTPASS SELF (INSTRUMENTS) IMPLANT
SET Y ADAPTER MULIT-BAG IRRIG (MISCELLANEOUS) ×2 IMPLANT
SLEEVE PROTECTION STRL DISP (MISCELLANEOUS) IMPLANT
SUT ETHILON 3-0 (SUTURE) ×1 IMPLANT
SYSTEM IMPL TENODESIS LNT 2.9 (Orthopedic Implant) IMPLANT
TUBE SET DOUBLEFLO INFLOW (TUBING) IMPLANT
TUBING CONNECTING 10 (TUBING) ×1 IMPLANT
TUBING OUTFLOW SET DBLFO PUMP (TUBING) ×1 IMPLANT
WAND WEREWOLF FLOW 90D (MISCELLANEOUS) ×1 IMPLANT
WRAPON POLAR PAD SHDR XLG (MISCELLANEOUS) ×1 IMPLANT

## 2023-10-01 NOTE — Anesthesia Procedure Notes (Signed)
 Anesthesia Regional Block: Interscalene brachial plexus block   Pre-Anesthetic Checklist: , timeout performed,  Correct Patient, Correct Site, Correct Laterality,  Correct Procedure, Correct Position, site marked,  Risks and benefits discussed,  Surgical consent,  Pre-op evaluation,  At surgeon's request and post-op pain management  Laterality: Right and Upper  Prep: chloraprep       Needles:  Injection technique: Single-shot  Needle Type: Stimiplex     Needle Length: 10cm  Needle Gauge: 21     Additional Needles:   Procedures:,,,, ultrasound used (permanent image in chart),,    Narrative:  Start time: 10/01/2023 6:56 AM End time: 10/01/2023 7:06 AM Injection made incrementally with aspirations every 5 mL.  Performed by: Personally  Anesthesiologist: Marisue Humble, MD  Additional Notes: Functioning IV was confirmed and monitors applied. Ultrasound guidance: relevant anatomy identified, needle position confirmed, local anesthetic spread visualized around nerve(s)., vascular puncture avoided.  Image printed for medical record.  Negative aspiration and no paresthesias; incremental administration of local anesthetic. The patient tolerated the procedure well. Vitals signs recorded in RN notes. Also intercostobrachial block and superficial cervical plexus block as field blocks

## 2023-10-01 NOTE — Transfer of Care (Signed)
 Immediate Anesthesia Transfer of Care Note  Patient: Megan Sheppard  Procedure(s) Performed: ARTHROSCOPY, SHOULDER WITH REPAIR, ROTATOR CUFF, OPEN (Right: Shoulder)  Patient Location: PACU  Anesthesia Type: General ETT  Level of Consciousness: awake, alert  and patient cooperative  Airway and Oxygen Therapy: Patient Spontanous Breathing and Patient connected to supplemental oxygen  Post-op Assessment: Post-op Vital signs reviewed, Patient's Cardiovascular Status Stable, Respiratory Function Stable, Patent Airway and No signs of Nausea or vomiting  Post-op Vital Signs: Reviewed and stable  Complications: No notable events documented.

## 2023-10-01 NOTE — H&P (Signed)
 Paper H&P to be scanned into permanent record. H&P reviewed. No significant changes noted.

## 2023-10-01 NOTE — Anesthesia Procedure Notes (Signed)
 Procedure Name: Intubation Date/Time: 10/01/2023 7:50 AM  Performed by: Domenic Moras, CRNAPre-anesthesia Checklist: Patient identified, Emergency Drugs available, Suction available and Patient being monitored Patient Re-evaluated:Patient Re-evaluated prior to induction Oxygen Delivery Method: Circle system utilized Preoxygenation: Pre-oxygenation with 100% oxygen Induction Type: IV induction, Rapid sequence and Cricoid Pressure applied Laryngoscope Size: Mac and 3 Grade View: Grade II Tube type: Oral Tube size: 6.5 mm Number of attempts: 1 Airway Equipment and Method: Stylet and Oral airway Placement Confirmation: ETT inserted through vocal cords under direct vision, positive ETCO2 and breath sounds checked- equal and bilateral Secured at: 21 cm Tube secured with: Tape Dental Injury: Teeth and Oropharynx as per pre-operative assessment

## 2023-10-01 NOTE — Anesthesia Postprocedure Evaluation (Signed)
 Anesthesia Post Note  Patient: Megan Sheppard  Procedure(s) Performed: ARTHROSCOPY, SHOULDER WITH REPAIR, ROTATOR CUFF, OPEN (Right: Shoulder)  Patient location during evaluation: PACU Anesthesia Type: General Level of consciousness: awake and alert Pain management: pain level controlled Vital Signs Assessment: post-procedure vital signs reviewed and stable Respiratory status: spontaneous breathing, nonlabored ventilation, respiratory function stable and patient connected to nasal cannula oxygen Cardiovascular status: blood pressure returned to baseline and stable Postop Assessment: no apparent nausea or vomiting Anesthetic complications: no   No notable events documented.   Last Vitals:  Vitals:   10/01/23 1106 10/01/23 1115  BP:  119/64  Pulse: 91 85  Resp: 14 14  Temp:  (!) 36.4 C  SpO2: 94% 93%    Last Pain:  Vitals:   10/01/23 1106  TempSrc:   PainSc: 5                  Toshiro Hanken C [place]

## 2023-10-01 NOTE — Op Note (Signed)
 SURGERY DATE: 10/01/2023   PRE-OP DIAGNOSIS:  1. Right subacromial impingement 2. Right biceps tendinopathy 3. Right rotator cuff tear 4. Right acromioclavicular joint arthritis  POST-OP DIAGNOSIS: 1. Right subacromial impingement 2. Right biceps tendinopathy 3. Right rotator cuff tear 4. Right acromioclavicular joint arthritis  PROCEDURES: 1. Right arthroscopic rotator cuff repair (full-thickness supraspinatus and partial-thickness subscapularis) 2. Right arthroscopic biceps tenodesis 3. Right arthroscopic subacromial decompression 4. Right arthroscopic extensive debridement of shoulder (glenohumeral and subacromial spaces) 5. Right arthroscopic distal clavicle excision  SURGEON: Rosealee Albee, MD  ANESTHESIA: Gen with Exparel interscalene block   ESTIMATED BLOOD LOSS: 5cc   DRAINS:  none   TOTAL IV FLUIDS: per anesthesia      SPECIMENS: none   IMPLANTS:  - Arthrex 2.92mm PushLock x 1 - Arthrex 4.50mm SwiveLock x 4 - Arthrex 5.5 mm SwiveLock x 1 - Iconix SPEED double loaded with 1.2 and 2.38mm tape x 2     OPERATIVE FINDINGS:  Examination under anesthesia: A careful examination under anesthesia was performed.  Passive range of motion was: FF: 150; ER at side: 60; ER in abduction: 90; IR in abduction: 45.  Anterior load shift: NT.  Posterior load shift: NT.  Sulcus in neutral: NT.  Sulcus in ER: NT.     Intra-operative findings: A thorough arthroscopic examination of the shoulder was performed.  The findings are: 1. Biceps tendon: tendinopathy with significant erythema and thickening 2. Superior labrum: erythema 3. Posterior labrum and capsule: normal 4. Inferior capsule and inferior recess: normal 5. Glenoid cartilage surface: Normal 6. Supraspinatus attachment: full-thickness tear of the supraspinatus 7. Posterior rotator cuff attachment: normal 8. Humeral head articular cartilage: normal except for focal area approximately 10 x 15 mm of grade 3-4 degenerative  change 9. Rotator interval: significant synovitis 10: Subscapularis tendon: Small, partial-thickness upper border tear 11. Anterior labrum: Mildly degenerative 12. IGHL: normal   OPERATIVE REPORT:    Indications for procedure:  Megan Sheppard is a 78 y.o. female with approximately 1 year of right shoulder pain after that has significantly worsened over the past 5 months.  She has had difficulty with overhead motion since that time with sensations of weakness. Clinical exam and MRI were suggestive of rotator cuff tear, biceps tendinopathy, acromioclavicular joint arthritis and subacromial impingement. She has failed nonoperative management. After discussion of risks, benefits, and alternatives to surgery, the patient elected to proceed.    Procedure in detail:   I identified Megan Sheppard in the pre-operative holding area.  I marked the operative shoulder with my initials. I reviewed the risks and benefits of the proposed surgical intervention, and the patient wished to proceed.  Anesthesia was then performed with an Exparel interscalene block.  The patient was transferred to the operative suite and placed in the beach chair position.     Appropriate IV antibiotics were administered prior to incision. The operative upper extremity was then prepped and draped in standard fashion. A time out was performed confirming the correct extremity, correct patient, and correct procedure.    I then created a standard posterior portal with an 11 blade. The glenohumeral joint was easily entered with a blunt trocar and the arthroscope introduced. The findings of diagnostic arthroscopy are described above. I debrided degenerative tissue including the synovitic tissue about the rotator interval and anterior and superior labrum. I then coagulated the inflamed synovium to obtain hemostasis and reduce the risk of post-operative swelling using an Arthrocare radiofrequency device.   I then turned my  attention to the  arthroscopic biceps tenodesis. The Loop n Tack technique was used to pass a FiberTape through the biceps in a locked fashion adjacent to the biceps anchor.  A hole for a 2.9 mm Arthrex PushLock was drilled in the bicipital groove just superior to the subscapularis tendon insertion.  The biceps tendon was then cut and the biceps anchor complex was debrided down to a stable base on the superior labrum.  The FiberTape was loaded onto the PushLock anchor and impacted into place into the previously drilled hole in the bicipital groove.  This appropriately secured the biceps into the bicipital groove and took it off of tension.   Next, arthroscopic repair of the subscapularis was performed. The lesser tuberosity footprint was prepared with a combination of electrocautery and an arthroscopic curette.  An Arthrex knotless corkscrew was placed into the lesser tuberosity footprint from the anterior portal.  A BirdBeak was used to shuttle the repair suture through the upper border of the subscapularis tendon. The suture was then shuttled through the anchor. With the arm in neutral rotation, the repair was tensioned appropriately. However, upon tensioning, the anchor pulled out of the bone. This was then replaced with an Arthrex 4.75 mm SwiveLock anchor. The knotless mechanism was passed through the subscapularis and repair was tensioned appropriately. This did achieve good fixation. The arm was then internally and externally rotated and the subscapularis was noted to move appropriately with rotation.  The remainder of the suture was then cut.  Next, the arthroscope was then introduced into the subacromial space. A direct lateral portal was created with an 11-blade after spinal needle localization. An extensive subacromial bursectomy and debridement was performed using a combination of the shaver and Arthrocare wand. The entire acromial undersurface was exposed and the CA ligament was subperiosteally elevated to expose the  anterior acromial hook. A burr was used to create a flat anterior and lateral aspect of the acromion, converting it from a Type 2 to a Type 1 acromion. Care was made to keep the deltoid fascia intact.   I then turned my attention to the arthroscopic distal clavicle excision. I identified the acromioclavicular joint. Surrounding bursal tissue was debrided and the edges of the joint were identified. I used the 5.30mm barrel burr to remove the distal clavicle parallel to the edge of the acromion. I was able to fit two widths of the burr into the space between the distal clavicle and acromion, signifying that I had removed ~28mm of distal clavicle. This was confirmed by viewing anteriorly and introducing a probe with measuring marks from the lateral portal. Hemostasis was achieved with an Arthrocare wand.   Next, I created an accessory posterolateral portal to assist with visualization and instrumentation.  I debrided the poor quality edges of the supraspinatus tendon.  This was a U-shaped tear of the supraspinatus.  I prepared the footprint using a burr to expose bleeding bone.     I then percutaneously placed one Iconix SPEED medial row anchor along the anterior portion of the tear at the articular margin. Another SPEED anchor was placed along the posterior portion of the tear at the articular margin. I then shuttled all the strands of tape through the rotator cuff just lateral to the musculotendinous junction using a FirstPass suture passer spanning the anterior to posterior extent of the tear. The posterior strands of each suture were passed through an [employer] anchor.  This was placed approximately 2 cm distal to the lateral edge of  the footprint in line with the posterior aspect of the tear with appropriate tensioning of each suture prior to final fixation.  Similarly, the anterior strands of each suture were passed through another SwiveLock anchor along the anterior margin of the tear.  The knotless  mechanism of the anterior SwiveLock anchor was utilized to reduce a central dogear.  At this point, I noted that there was still anterior uncoverage of the rotator cuff over the footprint.  A free suture tape was used and this was passed in a mattress fashion through the anterior supraspinatus.  I then attempted to pass this into a lateral row anchor with a PushLock anchor, but this did not achieve good fixation.  I then attempted a 4.75 mm SwiveLock, but this also did not achieve good fixation.  Finally I placed a 5.5 mm SwiveLock anchor.  This also had questionable fixation.  Therefore the previously utilized 4.75 mm SwiveLock anchor was placed in a buddy anchor fashion.  This construct did achieve good fixation.  At this point, there was excellent reapproximation of the rotator cuff to its native footprint without undue tension.  Appropriate compression was achieved.  The repair was stable to external and internal rotation.   Fluid was evacuated from the shoulder, and the portals were closed with 3-0 Nylon. Xeroform was applied to the portals. A sterile dressing was applied, followed by a Polar Care sleeve and a SlingShot shoulder immobilizer/sling. The patient was awakened from anesthesia without difficulty and was transferred to the PACU in stable condition.   COMPLICATIONS: none   DISPOSITION: plan for discharge home after recovery in PACU     POSTOPERATIVE PLAN: Remain in sling (except hygiene and elbow/wrist/hand RoM exercises as instructed by PT) x 6 weeks and NWB for this time. PT to begin 3-4 days after surgery.  Large rotator cuff repair rehab protocol with subscapularis restrictions. ASA 325mg  daily x 2 weeks for DVT ppx.

## 2023-10-01 NOTE — Progress Notes (Signed)
 Assisted [facility] ANMD with right, infraclavicular, ultrasound guided block. Side rails up, monitors on throughout procedure. See vital signs in flow sheet. Tolerated Procedure well.

## 2023-10-01 NOTE — Discharge Instructions (Addendum)
 Post-Op Instructions - Rotator Cuff Repair  1. Bracing: You will wear a shoulder immobilizer or sling for 6 weeks.   2. Driving: No driving for 3 weeks post-op. When driving, do not wear the immobilizer. Ideally, we recommend no driving for 6 weeks while sling is in place as one arm will be immobilized.   3. Activity: No active lifting for 2 months. Wrist, hand, and elbow motion only. Avoid lifting the upper arm away from the body except for hygiene. You are permitted to bend and straighten the elbow passively only (no active elbow motion). You may use your hand and wrist for typing, writing, and managing utensils (cutting food). Do not lift more than a coffee cup for 8 weeks.  When sleeping or resting, inclined positions (recliner chair or wedge pillow) and a pillow under the forearm for support may provide better comfort for up to 4 weeks.  Avoid long distance travel for 4 weeks.  Return to normal activities after rotator cuff repair repair normally takes 6 months on average. If rehab goes very well, may be able to do most activities at 4 months, except overhead or contact sports.  4. Physical Therapy: Begins 3-4 days after surgery, and proceed 1 time per week for the first 6 weeks, then 1-2 times per week from weeks 6-20 post-op.  5. Medications:  - You will be provided a prescription for narcotic pain medicine. After surgery, take 1-2 narcotic tablets every 4 hours if needed for severe pain.  - A prescription for anti-nausea medication will be provided in case the narcotic medicine causes nausea - take 1 tablet every 6 hours only if nauseated.   - Take tylenol 1000 mg (2 Extra Strength tablets or 3 regular strength) every 8 hours for pain.  May decrease or stop tylenol 5 days after surgery if you are having minimal pain. - Take ASA 325mg /day x 2 weeks to help prevent DVTs/PEs (blood clots).  - DO NOT take ANY nonsteroidal anti-inflammatory pain medications (Advil, Motrin, Ibuprofen, Aleve,  Naproxen, or Naprosyn). These medicines can inhibit healing of your shoulder repair.    If you are taking prescription medication for anxiety, depression, insomnia, muscle spasm, chronic pain, or for attention deficit disorder, you are advised that you are at a higher risk of adverse effects with use of narcotics post-op, including narcotic addiction/dependence, depressed breathing, death. If you use non-prescribed substances: alcohol, marijuana, cocaine, heroin, methamphetamines, etc., you are at a higher risk of adverse effects with use of narcotics post-op, including narcotic addiction/dependence, depressed breathing, death. You are advised that taking > 50 morphine milligram equivalents (MME) of narcotic pain medication per day results in twice the risk of overdose or death. For your prescription provided: oxycodone 5 mg - taking more than 6 tablets per day would result in > 50 morphine milligram equivalents (MME) of narcotic pain medication. Be advised that we will prescribe narcotics short-term, for acute post-operative pain only - 3 weeks for major operations such as shoulder repair/reconstruction surgeries.     6. Post-Op Appointment:  Your first post-op appointment will be 10-14 days post-op.  7. Work or School: For most, but not all procedures, we advise staying out of work or school for at least 1 to 2 weeks in order to recover from the stress of surgery and to allow time for healing.   If you need a work or school note this can be provided.   8. Smoking: If you are a smoker, you need to refrain from  smoking in the postoperative period. The nicotine in cigarettes will inhibit healing of your shoulder repair and decrease the chance of successful repair. Similarly, nicotine containing products (gum, patches) should be avoided.   Post-operative Brace: Apply and remove the brace you received as you were instructed to at the time of fitting and as described in detail as the brace's  instructions for use indicate.  Wear the brace for the period of time prescribed by your physician.  The brace can be cleaned with soap and water and allowed to air dry only.  Should the brace result in increased pain, decreased feeling (numbness/tingling), increased swelling or an overall worsening of your medical condition, please contact your doctor immediately.  If an emergency situation occurs as a result of wearing the brace after normal business hours, please dial 911 and seek immediate medical attention.  Let your doctor know if you have any further questions about the brace issued to you. Refer to the shoulder sling instructions for use if you have any questions regarding the correct fit of your shoulder sling.  [facility] Customer Care for Troubleshooting: [phone]  Video that illustrates how to properly use a shoulder sling: "Instructions for Proper Use of an Orthopaedic Sling" http://bass.com/       POLAR CARE INFORMATION  MassAdvertisement.it  How to use [employer] Polar Care [facility]?  YouTube   ShippingScam.co.uk  OPERATING INSTRUCTIONS  Start the product With dry hands, connect the transformer to the electrical connection located on the top of the cooler. Next, plug the transformer into an appropriate electrical outlet. The unit will automatically start running at this point.  To stop the pump, disconnect electrical power.  Unplug to stop the product when not in use. Unplugging the Polar Care unit turns it off. Always unplug immediately after use. Never leave it plugged in while unattended. Remove pad.    FIRST ADD WATER TO FILL LINE, THEN ICE---Replace ice when existing ice is almost melted  1 Discuss Treatment with your Licensed Health Care Practitioner and Use Only as Prescribed 2 Apply Insulation Barrier & Cold Therapy Pad 3 Check for Moisture 4 Inspect Skin Regularly  Tips and Trouble Shooting Usage  Tips 1. Use cubed or chunked ice for optimal performance. 2. It is recommended to drain the Pad between uses. To drain the pad, hold the Pad upright with the hose pointed toward the ground. Depress the black plunger and allow water to drain out. 3. You may disconnect the Pad from the unit without removing the pad from the affected area by depressing the silver tabs on the hose coupling and gently pulling the hoses apart. The Pad and unit will seal itself and will not leak. Note: Some dripping during release is normal. 4. DO NOT RUN PUMP WITHOUT WATER! The pump in this unit is designed to run with water. Running the unit without water will cause permanent damage to the pump. 5. Unplug unit before removing lid.  TROUBLESHOOTING GUIDE Pump not running, Water not flowing to the pad, Pad is not getting cold 1. Make sure the transformer is plugged into the wall outlet. 2. Confirm that the ice and water are filled to the indicated levels. 3. Make sure there are no kinks in the pad. 4. Gently pull on the blue tube to make sure the tube/pad junction is straight. 5. Remove the pad from the treatment site and ll it while the pad is lying at; then reapply. 6. Confirm that the pad couplings are securely  attached to the unit. Listen for the double clicks (Figure 1) to confirm the pad couplings are securely attached.  Leaks    Note: Some condensation on the lines, controller, and pads is unavoidable, especially in warmer climates. 1. If using a [employer] Polar Care Cold Therapy unit with a detachable Cold Therapy Pad, and a leak exists (other than condensation on the lines) disconnect the pad couplings. Make sure the silver tabs on the couplings are depressed before reconnecting the pad to the pump hose; then confirm both sides of the coupling are properly clicked in. 2. If the coupling continues to leak or a leak is detected in the pad itself, stop using it and call [employer] Customer Care at [phone]  [phone].  Cleaning After use, empty and dry the unit with a soft cloth. Warm water and mild detergent may be used occasionally to clean the pump and tubes.  WARNING: The Polar Care Cube can be cold enough to cause serious injury, including full skin necrosis. Follow these Operating Instructions, and carefully read the Product Insert (see pouch on side of unit) and the Cold Therapy Pad Fitting Instructions (provided with each Cold Therapy Pad) prior to use.       Information for Discharge Teaching: EXPAREL (bupivacaine liposome injectable suspension)   Pain relief is important to your recovery. The goal is to control your pain so you can move easier and return to your normal activities as soon as possible after your procedure. Your physician may use several types of medicines to manage pain, swelling, and more.  Your surgeon or anesthesiologist gave you EXPAREL(bupivacaine) to help control your pain after surgery.  EXPAREL is a local anesthetic designed to release slowly over an extended period of time to provide pain relief by numbing the tissue around the surgical site. EXPAREL is designed to release pain medication over time and can control pain for up to 72 hours. Depending on how you respond to EXPAREL, you may require less pain medication during your recovery. EXPAREL can help reduce or eliminate the need for opioids during the first few days after surgery when pain relief is needed the most. EXPAREL is not an opioid and is not addictive. It does not cause sleepiness or sedation.   Important! A teal colored band has been placed on your arm with the date, time and amount of EXPAREL you have received. Please leave this armband in place for the full 96 hours following administration, and then you may remove the band. If you return to the hospital for any reason within 96 hours following the administration of EXPAREL, the armband provides important information that your health care  providers to know, and alerts them that you have received this anesthetic.    Possible side effects of EXPAREL: Temporary loss of sensation or ability to move in the area where medication was injected. Nausea, vomiting, constipation Rarely, numbness and tingling in your mouth or lips, lightheadedness, or anxiety may occur. Call your doctor right away if you think you may be experiencing any of these sensations, or if you have other questions regarding possible side effects.  Follow all other discharge instructions given to you by your surgeon or nurse. Eat a healthy diet and drink plenty of water or other fluids.Information for Discharge Teaching: EXPAREL (bupivacaine liposome injectable suspension)   Pain relief is important to your recovery. The goal is to control your pain so you can move easier and return to your normal activities as soon as possible after your  procedure. Your physician may use several types of medicines to manage pain, swelling, and more.  Your surgeon or anesthesiologist gave you EXPAREL(bupivacaine) to help control your pain after surgery.  EXPAREL is a local anesthetic designed to release slowly over an extended period of time to provide pain relief by numbing the tissue around the surgical site. EXPAREL is designed to release pain medication over time and can control pain for up to 72 hours. Depending on how you respond to EXPAREL, you may require less pain medication during your recovery. EXPAREL can help reduce or eliminate the need for opioids during the first few days after surgery when pain relief is needed the most. EXPAREL is not an opioid and is not addictive. It does not cause sleepiness or sedation.   Important! A teal colored band has been placed on your arm with the date, time and amount of EXPAREL you have received. Please leave this armband in place for the full 96 hours following administration, and then you may remove the band. If you return to the  hospital for any reason within 96 hours following the administration of EXPAREL, the armband provides important information that your health care providers to know, and alerts them that you have received this anesthetic.    Possible side effects of EXPAREL: Temporary loss of sensation or ability to move in the area where medication was injected. Nausea, vomiting, constipation Rarely, numbness and tingling in your mouth or lips, lightheadedness, or anxiety may occur. Call your doctor right away if you think you may be experiencing any of these sensations, or if you have other questions regarding possible side effects.  Follow all other discharge instructions given to you by your surgeon or nurse. Eat a healthy diet and drink plenty of water or other fluids.Information for Discharge Teaching: EXPAREL (bupivacaine liposome injectable suspension)   Pain relief is important to your recovery. The goal is to control your pain so you can move easier and return to your normal activities as soon as possible after your procedure. Your physician may use several types of medicines to manage pain, swelling, and more.  Your surgeon or anesthesiologist gave you EXPAREL(bupivacaine) to help control your pain after surgery.  EXPAREL is a local anesthetic designed to release slowly over an extended period of time to provide pain relief by numbing the tissue around the surgical site. EXPAREL is designed to release pain medication over time and can control pain for up to 72 hours. Depending on how you respond to EXPAREL, you may require less pain medication during your recovery. EXPAREL can help reduce or eliminate the need for opioids during the first few days after surgery when pain relief is needed the most. EXPAREL is not an opioid and is not addictive. It does not cause sleepiness or sedation.   Important! A teal colored band has been placed on your arm with the date, time and amount of EXPAREL you have  received. Please leave this armband in place for the full 96 hours following administration, and then you may remove the band. If you return to the hospital for any reason within 96 hours following the administration of EXPAREL, the armband provides important information that your health care providers to know, and alerts them that you have received this anesthetic.    Possible side effects of EXPAREL: Temporary loss of sensation or ability to move in the area where medication was injected. Nausea, vomiting, constipation Rarely, numbness and tingling in your mouth or lips, lightheadedness, or  anxiety may occur. Call your doctor right away if you think you may be experiencing any of these sensations, or if you have other questions regarding possible side effects.  Follow all other discharge instructions given to you by your surgeon or nurse. Eat a healthy diet and drink plenty of water or other fluids.Information for Discharge Teaching: EXPAREL (bupivacaine liposome injectable suspension)   Pain relief is important to your recovery. The goal is to control your pain so you can move easier and return to your normal activities as soon as possible after your procedure. Your physician may use several types of medicines to manage pain, swelling, and more.  Your surgeon or anesthesiologist gave you EXPAREL(bupivacaine) to help control your pain after surgery.  EXPAREL is a local anesthetic designed to release slowly over an extended period of time to provide pain relief by numbing the tissue around the surgical site. EXPAREL is designed to release pain medication over time and can control pain for up to 72 hours. Depending on how you respond to EXPAREL, you may require less pain medication during your recovery. EXPAREL can help reduce or eliminate the need for opioids during the first few days after surgery when pain relief is needed the most. EXPAREL is not an opioid and is not addictive. It does not  cause sleepiness or sedation.   Important! A teal colored band has been placed on your arm with the date, time and amount of EXPAREL you have received. Please leave this armband in place for the full 96 hours following administration, and then you may remove the band. If you return to the hospital for any reason within 96 hours following the administration of EXPAREL, the armband provides important information that your health care providers to know, and alerts them that you have received this anesthetic.    Possible side effects of EXPAREL: Temporary loss of sensation or ability to move in the area where medication was injected. Nausea, vomiting, constipation Rarely, numbness and tingling in your mouth or lips, lightheadedness, or anxiety may occur. Call your doctor right away if you think you may be experiencing any of these sensations, or if you have other questions regarding possible side effects.  Follow all other discharge instructions given to you by your surgeon or nurse. Eat a healthy diet and drink plenty of water or other fluids.  PERIPHERAL NERVE BLOCK PATIENT INFORMATION  Your surgeon has requested a peripheral nerve block for your surgery. This anesthetic technique provides excellent post-operative pain relief for you in a safe and effective manner. It will also help reduce the risk of nausea and vomiting and allow earlier discharge from the hospital.   The block is performed under sedation with ultrasound guidance prior to your procedure. Due to the sedation, your may or may not remember the block experience. The nerve block will begin to take effect anywhere from 5 to 30 minutes after being administered. You will be transported to the operating room from your surgery after the block is completed.   At the end of surgery, when the anesthesia wears off, you will notice a few things. Your may not be able to move or feel the part of your body targeted by the nerve block. These are normal  experiences, and they will disappear as the block wears off.  If you had an interscalene nerve block performed (which is common for shoulder surgery), your voice can be very hoarse and you may feel that you are not able to take as  deep a breath as you did before surgery. Some patients may also notice a droopy eyelid on the affected side. These symptoms will resolve once the block wears off.  Pain control: The nerve block technique used is a single injection that can last anywhere from 1-3 days. The duration of the numbness can vary between individuals. After leaving the hospital, it is important that you begin to take your prescribed pain medication when you start to sense the nerve block wearing off. This will help you avoid unpleasant pain at the time the nerve block wears off, which can sometimes be in the middle of the night. The block will only cover pain in the areas targeted by the nerve block so if you experience surgical pain outside of that area, please take your prescribed pain medication. Management of the "numb area": After a nerve block, you cannot feel pain, pressure, or temperature in the affected area so there is an increased risk for injury. You should take extra care to protect the affected areas until sensation and movement returns. Please take caution to not come in contact with extremely hot or cold items because you will not be able to sense or protect yourself form the extremes of temperature.  You may experience some persistent numbness after the procedure by most neurological deficits resolve over time and the incidence of serious long term neurological complications attributable to peripheral nerve blocks are relatively uncommon.

## 2023-10-02 ENCOUNTER — Encounter: Payer: Self-pay | Admitting: Orthopedic Surgery
